# Patient Record
Sex: Male | Born: 1975
Health system: Southern US, Community
[De-identification: ages and names within clinical notes are randomized; demographics above are authoritative.]

## PROBLEM LIST (undated history)

## (undated) HISTORY — PX: FRACTURE SURGERY: SHX138

---

## 2005-05-04 ENCOUNTER — Ambulatory Visit (HOSPITAL_COMMUNITY): Admission: RE | Admit: 2005-05-04 | Discharge: 2005-05-05 | Payer: Self-pay | Admitting: Orthopaedic Surgery

## 2006-10-04 ENCOUNTER — Other Ambulatory Visit: Admission: RE | Admit: 2006-10-04 | Discharge: 2006-10-04 | Payer: Self-pay | Admitting: Otolaryngology

## 2006-11-13 ENCOUNTER — Encounter: Admission: RE | Admit: 2006-11-13 | Discharge: 2006-11-13 | Payer: Self-pay | Admitting: Otolaryngology

## 2008-02-24 ENCOUNTER — Emergency Department (HOSPITAL_BASED_OUTPATIENT_CLINIC_OR_DEPARTMENT_OTHER): Admission: EM | Admit: 2008-02-24 | Discharge: 2008-02-24 | Payer: Self-pay | Admitting: Emergency Medicine

## 2009-07-05 IMAGING — CT CT NECK W/ CM
3 of 4 series · 16 of 33 positions shown, 19 images · IV contrast ([ID] OMNI 300)
Comparison: None.

CLINICAL DATA: Painless left neck mass for six months.  Question left branchial cleft cyst.  
NECK CT WITH CONTRAST:
TECHNIQUE: Multidetector CT imaging of the neck was performed following the standard protocol during administration of intravenous contrast.
Contrast:  100 cc of Omnipaque 300.

[Series 2: neck w/ · axial · 0.39mm/px · z∈[-275,-35]mm · 8 of 82 slices shown, 10 images]
[im 9/82  soft-tissue]
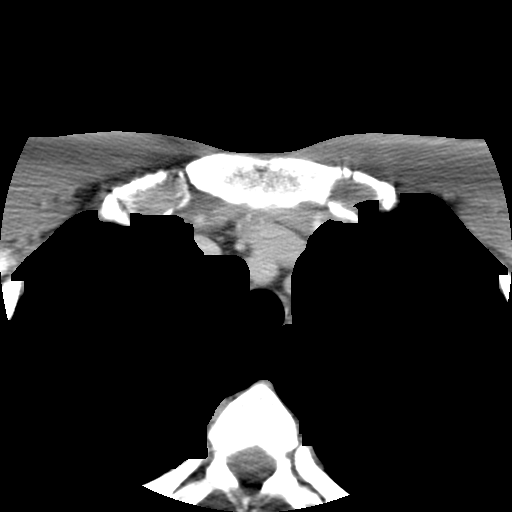
[im 9/82  bone]
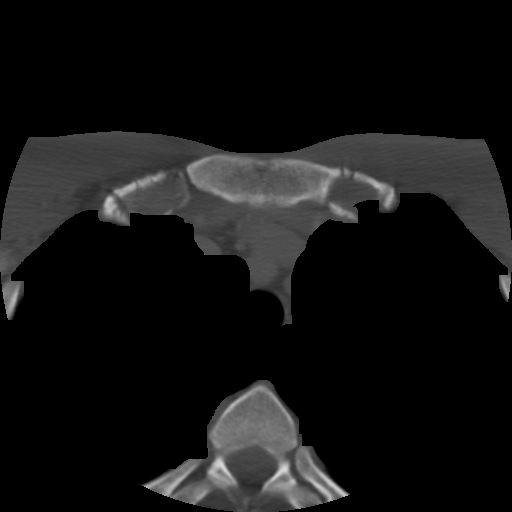
[im 17/82  bone]
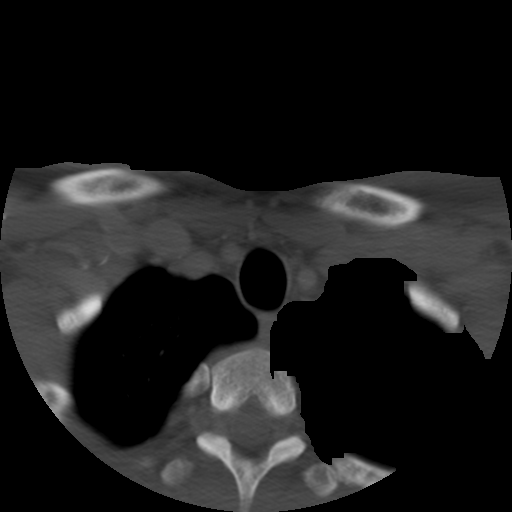
[im 25/82  bone]
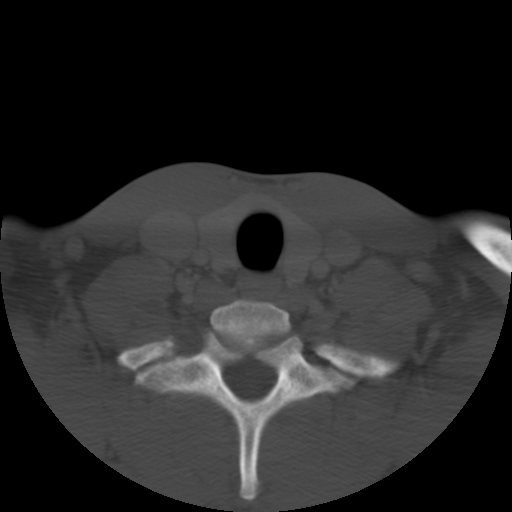
[im 33/82  bone]
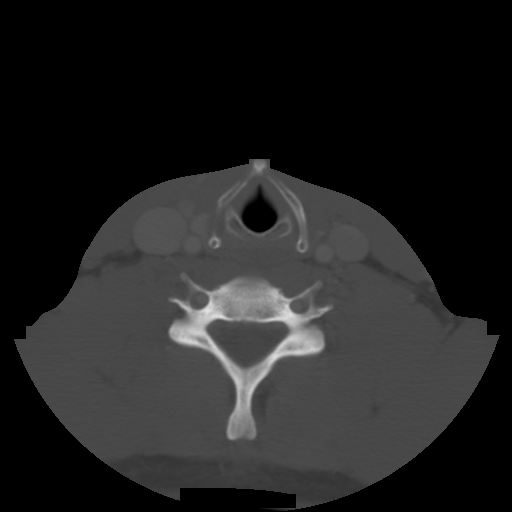
[im 49/82  soft-tissue]
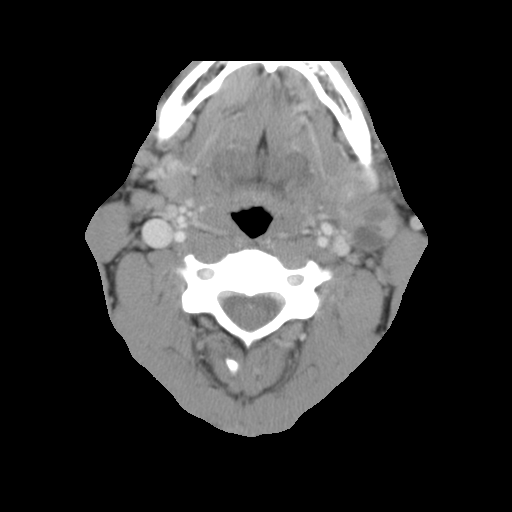
[im 49/82  bone]
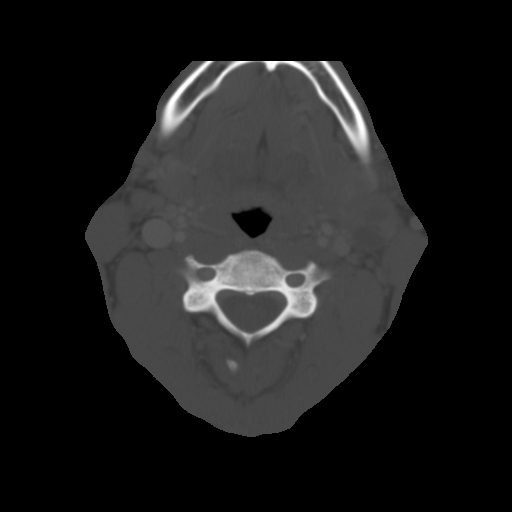
[im 57/82  bone]
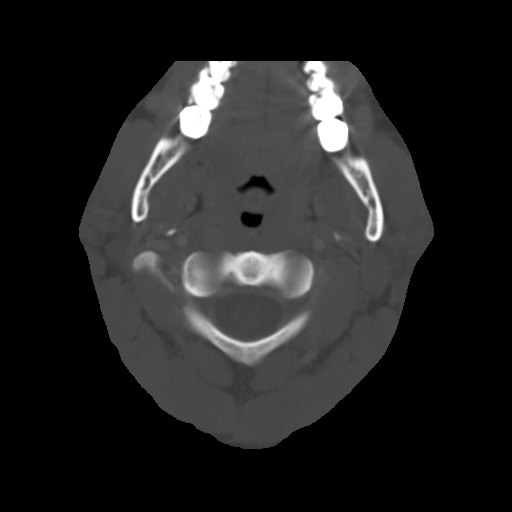
[im 65/82  bone]
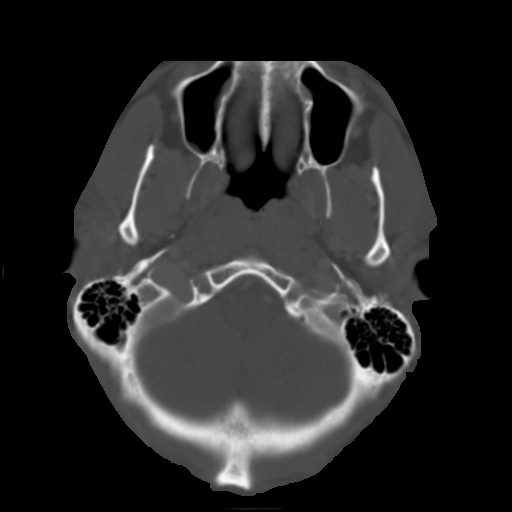
[im 73/82  bone]
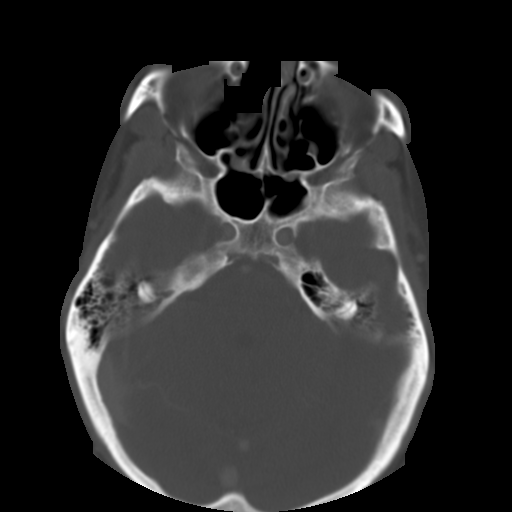

[Series 400: cor neck · coronal · 0.61mm/px · 3 of 80 slices shown]
[im 16/80  bone]
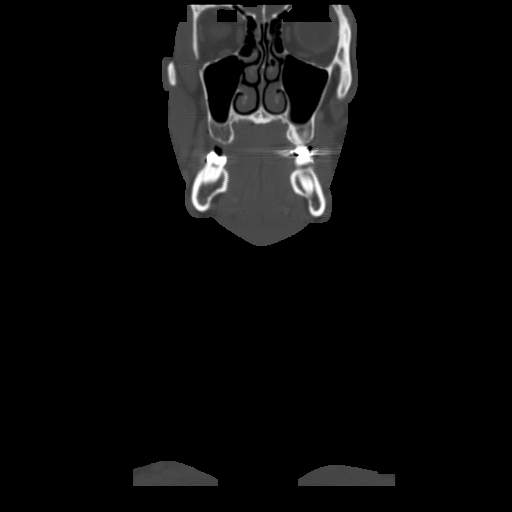
[im 32/80  bone]
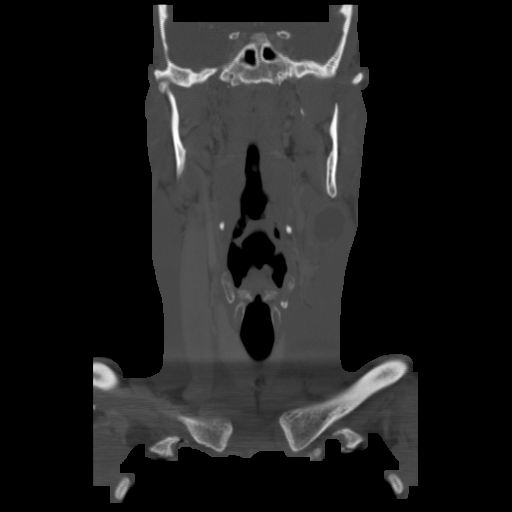
[im 48/80  bone]
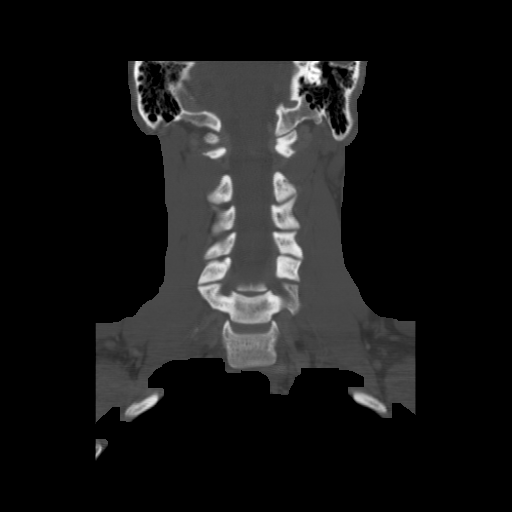

[Series 401: sag neck · sagittal · 0.61mm/px · 5 of 80 slices shown, 6 images]
[im 27/80  bone]
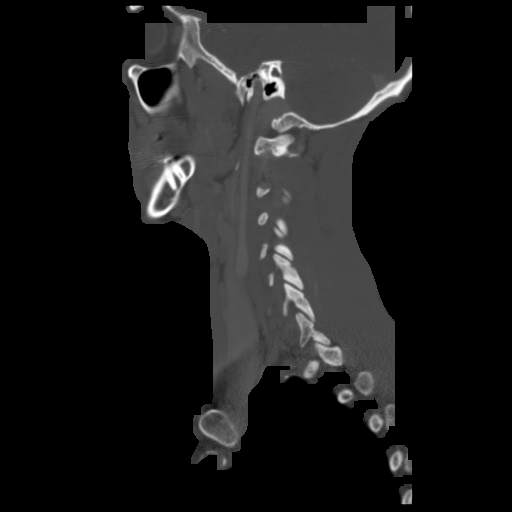
[im 33/80  bone]
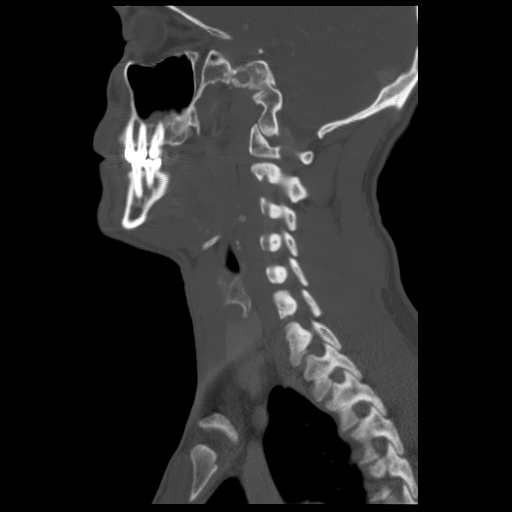
[im 40/80  soft-tissue]
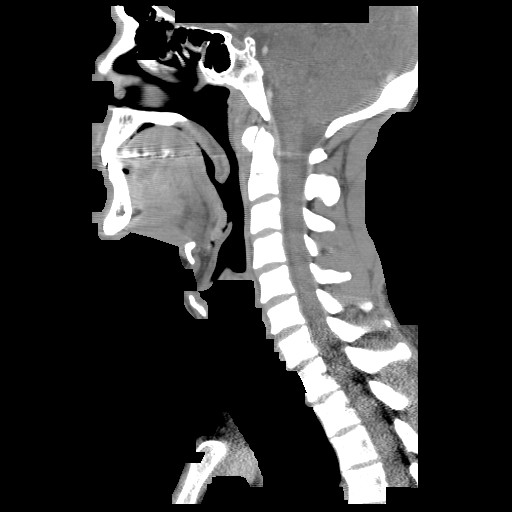
[im 40/80  bone]
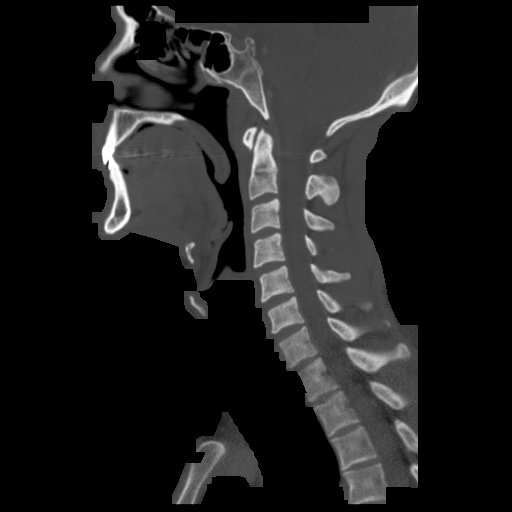
[im 47/80  bone]
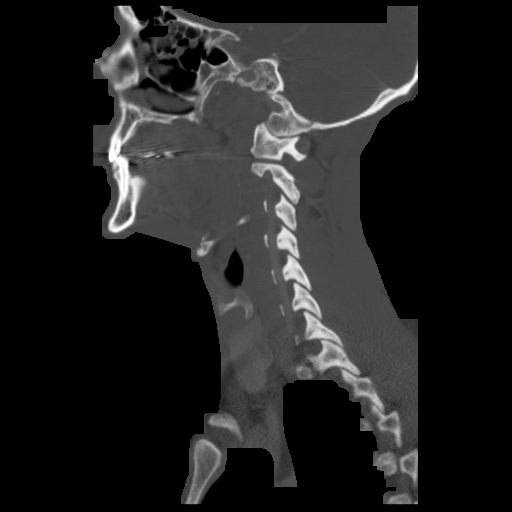
[im 53/80  bone]
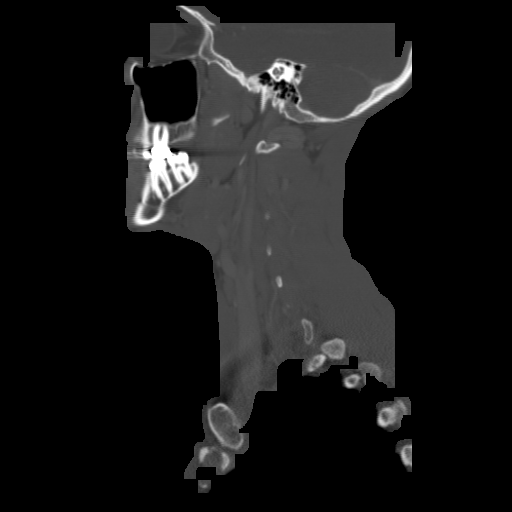

[16 of 33 positions shown; findings below may reference images not displayed]

FINDINGS: Located between the posterior aspect of the left submandibular gland and the anteromedial aspect of the left sternocleidomastoid muscle is a 2.6 x 2.3 x 2.7 cm mass which is of low density centrally (Hounsfield units slightly higher than simple fluid) and has peripheral slight irregularity enhancement with minimal septation.  The location is suggestive of left branchial cleft cyst which appears minimally complex secondary to result of infection/prior infections.   secondary consideration is a cystic necrotic metastatic node.  No other adenopathy.  No primary malignancy identified.  
Minimal scarring of lung apices.  No focal thyroid lesion.  Visualized intracranial structures are unremarkable.  Slight narrowing of the left jugular vein at the C1 level without adjacent mass.  This may represent a normal variant.
IMPRESSION: Left neck mass suggestive of a second branchial cleft cyst. A less likely consideration is a cystic necrotic metastatic node.  No other adenopathy.  No primary malignancy identified. Please see above discussion.

## 2010-05-16 DIAGNOSIS — K5190 Ulcerative colitis, unspecified, without complications: Secondary | ICD-10-CM

## 2010-05-19 NOTE — Op Note (Signed)
Ryan Sutton, Ryan Sutton               ACCOUNT NO.:  [id number]   MEDICAL RECORD NO.:  [id number]          PATIENT TYPE:  OIB   LOCATION:  5004                         FACILITY:  [facility]   PHYSICIAN:  [name]. Magnus Ivan, M.D.DATE OF BIRTH:  07-17-1975   DATE OF PROCEDURE:  05/04/2005  DATE OF DISCHARGE:                                 OPERATIVE REPORT   PREOPERATIVE DIAGNOSIS:  Left radial shaft fracture.   POSTOPERATIVE DIAGNOSIS:  Left radial shaft fracture.   PROCEDURE:  Open reduction and internal fixation of left radial shaft  fracture using 3.5 mm LC-DCP plate.   SURGEON:  [name]. Magnus Ivan, M.D.   ANESTHESIA:  General.   ANTIBIOTICS:  1 g IV Ancef.   BLOOD LOSS:  Minimal.   TOURNIQUET TIME:  1 hour 1 minute.   COMPLICATIONS:  None.   INDICATIONS:  Briefly, Mr. Seamans is a 35 year old right-hand dominant male  who was involved in wrestling and martial arts type of event when he  sustained a fracture to his nondominant right radius.  This was a fracture  at the shaft.  This occurred yesterday.  He was seen at an urgent care  center and placed in a splint and given follow-up in my office today.  I  decided to take him to the operating room tonight due to the unstable nature  of the fracture such as this.  The risks and benefits of surgery were  explained to him and were well-understood, and he agreed to proceed with  surgery.   PROCEDURE DESCRIPTION:  After informed consent was obtained and the  appropriate left arm was marked, Mr. Fray was brought to the operating room  and placed supine on the operating table.  General anesthesia was then  obtained.  A nonsterile tourniquet placed around his upper arm, his  splint  was removed and his arm was prepped and draped with DuraPrep and sterile  drapes.  A standard anterior approach to the forearm was carried out, which  is the approach of Sherilyn Cooter.  Meticulous attention was carried for dissection  of the soft tissue  structures.  The neurovascular bundles were all  protected.  The fracture then was easily identified the fracture hematoma  was cleaned of debris using irrigation and a small rongeur.  Once the  fracture was identified, reduction forceps were used to bring the fracture  into an easily reducible position.  A seven-hole 3.5 mm LC-DCP plate was  then chosen and placed on the volar aspect of the radial shaft.  One screw  was placed in a lag format lagging the fracture, and then the plate was  further secured with three proximal and three distal screws, all bicortical.  The two screws on either side of the fracture were placed in compression.  Once the fracture was reduced and the plate was secured, I did assess the  forearm radiographically.  All screw lengths were found to be adequate.  The  arm was stable in terms of the fracture and overall alignment.  I then  assessed the wrist joint for stability of the DRUJ.  Under  fluoroscopic  guidance the wrist joint was table.  The wound was then copiously irrigated  and the tourniquet was let down at 1 hour 1 minute and hemostasis was  obtained.  I then closed the subcutaneous tissue with a 2-0 Vicryl suture  followed by interrupted 3-0 Prolene sutures along the incision.  Xeroform  followed by a well-padded sterile dressing was applied and a well-padded  posterior splint with the forearm in a supinated position.  Once the  tourniquet again was let down, the fingers had taken nicely.  After the  splint hardened the patient was awakened, extubated, and taken to the  recovery room in stable condition.  There were no complications, and all  final counts were correct.           ______________________________  [name]. Magnus Ivan, M.D.     CYB/MEDQ  D:  05/04/2005  T:  05/07/2005  Job:  [id number]

## 2017-03-27 ENCOUNTER — Other Ambulatory Visit: Payer: Self-pay

## 2017-03-27 ENCOUNTER — Ambulatory Visit (INDEPENDENT_AMBULATORY_CARE_PROVIDER_SITE_OTHER): Payer: BLUE CROSS/BLUE SHIELD | Admitting: Family Medicine

## 2017-03-27 ENCOUNTER — Encounter: Payer: Self-pay | Admitting: Family Medicine

## 2017-03-27 VITALS — BP 100/62 | HR 60 | Temp 97.8°F | Ht 71.5 in | Wt 190.2 lb

## 2017-03-27 DIAGNOSIS — M5416 Radiculopathy, lumbar region: Secondary | ICD-10-CM

## 2017-03-27 MED ORDER — AMITRIPTYLINE HCL 25 MG PO TABS: 25.0000 mg | ORAL_TABLET | Freq: Every day | ORAL | 1 refills | Status: DC

## 2017-03-27 MED ORDER — PREDNISONE 20 MG PO TABS: ORAL_TABLET | ORAL | 0 refills | Status: DC

## 2017-03-27 NOTE — Progress Notes (Signed)
Dr. Karleen HampshireSpencer T. Lem Peary, MD, CAQ Sports Medicine Primary Care and Sports Medicine [address] [place], [address] Phone: [phone] Fax: [phone]  03/27/2017  Patient: Ryan Sutton, MRN: [id number], DOB: 10/06/1975, 42 y.o.  Primary Physician:  No primary care provider on file.   Chief Complaint  Patient presents with  . Hip Pain  . Sciatica   Subjective:   Ryan HarmsGabriel Sutton is a 42 y.o. very pleasant male patient who presents with the following: Back Pain  New patient, known well.  ongoing for approximately: 2 mo The patient has had back pain before. The back pain is localized into the L buttocks area. They also describe L radiculopathy.  Numbness in the bottom of the left foot. Comes and goes.  Sometimes cannot lift his left shin.  No training accidents - no back pain.   Massage therapist.  Heating pad, epsom soaks, ibuprofen.  - none has helped too much  As above numbness or tingling. No bowel or bladder incontinence. Weakness at L toe Prior interventions: none Physical therapy: No Chiropractic manipulations: no Acupuncture: No Osteopathic manipulation: No Heat or cold: Minimal effect  Past Medical History, Surgical History, Family History, Medications, Allergies have been reviewed and updated if relevant.  Medication list reviewed and [facility].  GEN: No fevers, chills. Nontoxic. Primarily MSK c/o today. MSK: Detailed in the HPI GI: tolerating PO intake without difficulty Neuro: As above  Otherwise the pertinent positives of the ROS are noted above.    Objective:   Blood pressure 100/62, pulse 60, temperature 97.8 F (36.6 C), temperature source Oral, height 5' 11.5" (1.816 m), weight 190 lb 4 oz (86.3 kg).  Gen: Well-developed,well-nourished,in no acute distress; alert,appropriate and cooperative throughout examination HEENT: Normocephalic and atraumatic without obvious abnormalities.  Ears, externally no  deformities Pulm: Breathing comfortably in no respiratory distress Range of motion at  the waist: Flexion, rotation and lateral bending: full  No echymosis or edema Rises to examination table with no difficulty Gait: minimally antalgic  Inspection/Deformity: No abnormality Paraspinus T:  Minimally tender  B Ankle Dorsiflexion (L5,4): 5/5 B Great Toe Dorsiflexion (L5,4): 4-/5 Heel Walk (L5): WNL Toe Walk (S1): WNL Rise/Squat (L4): WNL, mild pain  SENSORY B Medial Foot (L4): decreased B Dorsum (L5): decreased B Lateral (S1): decreased Light Touch: decreased Pinprick: decreased - at the L foot  REFLEXES Knee (L4): 2+ Ankle (S1): 2+  B SLR, seated: neg B SLR, supine: + on the L B FABER: neg B Reverse FABER: neg B Greater Troch: NT B Log Roll: neg B Stork: NT B Sciatic Notch: NT  Radiology: No results found.  Assessment and Plan:   Left lumbar radiculopathy   Intermittent problem over years, but worse in the last 2 weeks.  Radicular symptoms with some numbness and some weakness at the great toe.  Concerning for potential L4-5 or L5-S1 impairment.  We will try a longer course of some steroids as well as some amitriptyline, and hopefully this will bring him some relief.  We will touch base in a few weeks to see how he is doing.  Meds ordered this encounter  Medications  . predniSONE (DELTASONE) 20 MG tablet    Sig: 2 tabs po for 7 days, then 1 tab po for 7 days    Dispense:  21 tablet    Refill:  0  . amitriptyline (ELAVIL) 25 MG tablet    Sig: Take 1 tablet (25 mg total) by mouth  at bedtime.    Dispense:  30 tablet    Refill:  1   Signed,  Aarit Kashuba T. Killian Ress, MD   Allergies as of 03/27/2017      Reactions   Latex Rash   Shellfish Allergy Swelling      Medication List        Accurate as of 03/27/17 11:59 PM. Always use your most recent med list.          amitriptyline 25 MG tablet Commonly known as:  ELAVIL Take 1 tablet (25 mg total) by mouth  at bedtime.   Fish Oil 1000 MG Caps Take by mouth.   predniSONE 20 MG tablet Commonly known as:  DELTASONE 2 tabs po for 7 days, then 1 tab po for 7 days   THERA Tabs Take by mouth.

## 2017-03-29 ENCOUNTER — Encounter: Payer: Self-pay | Admitting: Family Medicine

## 2017-09-10 DIAGNOSIS — R1031 Right lower quadrant pain: Secondary | ICD-10-CM | POA: Diagnosis not present

## 2017-09-10 DIAGNOSIS — R109 Unspecified abdominal pain: Secondary | ICD-10-CM | POA: Diagnosis not present

## 2017-09-10 DIAGNOSIS — R1011 Right upper quadrant pain: Secondary | ICD-10-CM | POA: Diagnosis not present

## 2017-09-11 ENCOUNTER — Encounter (HOSPITAL_COMMUNITY): Payer: Self-pay

## 2017-09-11 ENCOUNTER — Observation Stay (HOSPITAL_COMMUNITY)
Admission: EM | Admit: 2017-09-11 | Discharge: 2017-09-14 | Disposition: A | Discharge status: Home / Self Care | Payer: BLUE CROSS/BLUE SHIELD | Attending: Surgery | Admitting: Surgery

## 2017-09-11 ENCOUNTER — Ambulatory Visit (INDEPENDENT_AMBULATORY_CARE_PROVIDER_SITE_OTHER): Payer: BLUE CROSS/BLUE SHIELD | Admitting: Family Medicine

## 2017-09-11 ENCOUNTER — Other Ambulatory Visit: Payer: Self-pay

## 2017-09-11 ENCOUNTER — Encounter: Payer: Self-pay | Admitting: Family Medicine

## 2017-09-11 VITALS — BP 104/70 | HR 79 | Temp 98.8°F | Ht 71.5 in | Wt 185.5 lb

## 2017-09-11 DIAGNOSIS — K8000 Calculus of gallbladder with acute cholecystitis without obstruction: Principal | ICD-10-CM | POA: Insufficient documentation

## 2017-09-11 DIAGNOSIS — K82A1 Gangrene of gallbladder in cholecystitis: Secondary | ICD-10-CM | POA: Diagnosis not present

## 2017-09-11 DIAGNOSIS — Z91013 Allergy to seafood: Secondary | ICD-10-CM | POA: Diagnosis not present

## 2017-09-11 DIAGNOSIS — Z87891 Personal history of nicotine dependence: Secondary | ICD-10-CM | POA: Diagnosis not present

## 2017-09-11 DIAGNOSIS — K828 Other specified diseases of gallbladder: Secondary | ICD-10-CM | POA: Diagnosis not present

## 2017-09-11 DIAGNOSIS — K810 Acute cholecystitis: Secondary | ICD-10-CM

## 2017-09-11 DIAGNOSIS — R935 Abnormal findings on diagnostic imaging of other abdominal regions, including retroperitoneum: Secondary | ICD-10-CM | POA: Diagnosis not present

## 2017-09-11 DIAGNOSIS — Z91040 Latex allergy status: Secondary | ICD-10-CM | POA: Diagnosis not present

## 2017-09-11 DIAGNOSIS — R828 Abnormal findings on cytological and histological examination of urine: Secondary | ICD-10-CM | POA: Diagnosis not present

## 2017-09-11 DIAGNOSIS — K8020 Calculus of gallbladder without cholecystitis without obstruction: Secondary | ICD-10-CM | POA: Diagnosis not present

## 2017-09-11 DIAGNOSIS — K8010 Calculus of gallbladder with chronic cholecystitis without obstruction: Secondary | ICD-10-CM | POA: Diagnosis present

## 2017-09-11 DIAGNOSIS — K819 Cholecystitis, unspecified: Secondary | ICD-10-CM

## 2017-09-11 DIAGNOSIS — R109 Unspecified abdominal pain: Secondary | ICD-10-CM | POA: Diagnosis not present

## 2017-09-11 DIAGNOSIS — R110 Nausea: Secondary | ICD-10-CM | POA: Diagnosis not present

## 2017-09-11 LAB — COMPREHENSIVE METABOLIC PANEL
ALBUMIN: 3.6 g/dL (ref 3.5–5.0)
ALT: 200 U/L — ABNORMAL HIGH (ref 0–44)
AST: 140 U/L — ABNORMAL HIGH (ref 15–41)
Alkaline Phosphatase: 106 U/L (ref 38–126)
Anion gap: 7 (ref 5–15)
BUN: 11 mg/dL (ref 6–20)
CHLORIDE: 100 mmol/L (ref 98–111)
CO2: 30 mmol/L (ref 22–32)
CREATININE: 1.07 mg/dL (ref 0.61–1.24)
Calcium: 9 mg/dL (ref 8.9–10.3)
GFR calc non Af Amer: >60 mL/min (ref >60)
GLUCOSE: 120 mg/dL — AB (ref 70–99)
Potassium: 4 mmol/L (ref 3.5–5.1)
SODIUM: 137 mmol/L (ref 135–145)
Total Bilirubin: 0.8 mg/dL (ref 0.3–1.2)
Total Protein: 7.3 g/dL (ref 6.5–8.1)

## 2017-09-11 LAB — CBC
HCT: 37.3 % — ABNORMAL LOW (ref 39.0–52.0)
HEMOGLOBIN: 12.7 g/dL — AB (ref 13.0–17.0)
MCH: 31.8 pg (ref 26.0–34.0)
MCHC: 34 g/dL (ref 30.0–36.0)
MCV: 93.5 fL (ref 78.0–100.0)
PLATELETS: 175 10*3/uL (ref 150–400)
RBC: 3.99 MIL/uL — AB (ref 4.22–5.81)
RDW: 12.4 % (ref 11.5–15.5)
WBC: 16.4 10*3/uL — ABNORMAL HIGH (ref 4.0–10.5)

## 2017-09-11 MED ORDER — SODIUM CHLORIDE 0.9 % IV SOLN
INTRAVENOUS | Status: DC
  Administered 2017-09-11: 21:00:00 via INTRAVENOUS

## 2017-09-11 MED ORDER — ACETAMINOPHEN 325 MG PO TABS
650.0000 mg | ORAL_TABLET | Freq: Once | ORAL | Status: AC
  Administered 2017-09-11: 650 mg via ORAL
  Filled 2017-09-11: qty 2

## 2017-09-11 MED ORDER — SODIUM CHLORIDE 0.9 % IV SOLN
2.0000 g | Freq: Once | INTRAVENOUS | Status: AC
  Administered 2017-09-11: 2 g via INTRAVENOUS
  Filled 2017-09-11: qty 20

## 2017-09-11 NOTE — ED Triage Notes (Signed)
Pt states that he was sent from PCP for cholecystomy. Pt states he is not currently having as much pain as yesterday.

## 2017-09-11 NOTE — ED Notes (Signed)
Surgerion was at bedside.

## 2017-09-11 NOTE — Progress Notes (Signed)
Dr. Karleen Hampshire T. Jacoby Zanni, MD, CAQ Sports Medicine Primary Care and Sports Medicine [address] [place], [address] Phone: [phone] Fax: [phone]  09/11/2017  Patient: Ryan Sutton, MRN: [id number], DOB: [birth year], 42 y.o.  Primary Physician:  No primary care provider on file.   Chief Complaint  Patient presents with  . Abdominal Pain    Started Sunday evening   Subjective:   Ryan Sutton is a 42 y.o. very pleasant male patient who presents with the following:  New onset abdominal pain which started on Sunday.   Sunday night ate some chicken and rice and was not really anything too bad. Got gradually worse and then threw up and partially digested pepperoni. RUQ pain and it was pretty intense and did not sleep much. Monday went to work, felt like had a fever. Tok some advill and went home. UC told him to go to the ER.   Labs reviewed WBC 18,000 No diff  AST and ALT 60's Lipase and amylase are normal  Fever was close to 100 most of the day.  [place] was this morning - sheets drenched with sweat. 101 temperature this morning.   He is feeling better this morning. He did not eat much lunch. Has been having BM and flatus.  No prior abd surgery.  Past Medical History, Surgical History, Social History, Family History, Problem List, Medications, and Allergies have been reviewed and updated if relevant.  There are no active problems to display for this patient.   No past medical history on file.  Past Surgical History:  Procedure Laterality Date  . FRACTURE SURGERY      Social History   Socioeconomic History  . Marital status: Married    Spouse name: Not on file  . Number of children: 0  . Years of education: Not on file  . Highest education level: Not on file  Occupational History  . Occupation: tattoo    Comment: [employer]  Social Needs  . Financial resource strain: Not on file  . Food insecurity:    Worry: Not on file    Inability: Not on file   . Transportation needs:    Medical: Not on file    Non-medical: Not on file  Tobacco Use  . Smoking status: Former Games developer  . Smokeless tobacco: Former Engineer, water and Sexual Activity  . Alcohol use: Yes  . Drug use: Not Currently  . Sexual activity: Not on file  Lifestyle  . Physical activity:    Days per week: Not on file    Minutes per session: Not on file  . Stress: Not on file  Relationships  . Social connections:    Talks on phone: Not on file    Gets together: Not on file    Attends religious service: Not on file    Active member of club or organization: Not on file    Attends meetings of clubs or organizations: Not on file    Relationship status: Not on file  . Intimate partner violence:    Fear of current or ex partner: Not on file    Emotionally abused: Not on file    Physically abused: Not on file    Forced sexual activity: Not on file  Other Topics Concern  . 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  Allergies  Allergen Reactions  . Latex Rash  . Shellfish Allergy Swelling    Medication list reviewed and updated in full in [place] Link.   GEN: + fever and chills GI: + N and V Pulm: No SOB Interactive and getting along well at home.  Otherwise, ROS is as per the HPI.  Objective:   BP 104/70   Pulse 79   Temp 98.8 F (37.1 C) (Oral)   Ht 5' 11.5" (1.816 m)   Wt 185 lb 8 oz (84.1 kg)   BMI 25.51 kg/m   GEN: WDWN, NAD, Non-toxic, A & O x 3 HEENT: Atraumatic, Normocephalic. Neck supple. No masses, No LAD. Ears and Nose: No external deformity. CV: RRR, No M/G/R. No JVD. No thrill. No extra heart sounds. PULM: CTA B, no wheezes, crackles, rhonchi. No retractions. No resp. distress. No accessory muscle use. ABD: S, RUQ tenderness, ND, + BS, No rebound, No HSM  EXTR: No c/c/e NEURO Normal gait.  PSYCH:  Normally interactive. Conversant. Not depressed or anxious appearing.  Calm demeanor.   Laboratory and Imaging Data: Acute Interface, Incoming Rad Results - 09/11/2017 11:48 AM EDT TECHNIQUE: Complete abdominal ultrasound. COMPARISON: None.   INDICATION: Abd Pain.  Right-sided abdominal pain, fever, nausea  FINDINGS:  PANCREAS:  No focal abnormalities are identified. Visualization is limited.  VASCULATURE: No abdominal aortic aneurysm. Visualized IVC is patent. Portal vein is patent with normal flow direction.  HEPATOBILIARY: Liver: Normal. 8 mm cyst in the right lobe. Prominent intrahepatic ducts.  Gallbladder: Gallstones. Gallbladder wall is thickened measuring up to 9 mm. There are 2 hypoechoic areas within the gallbladder measuring up to 2.7 cm. No internal vascularity. CBD: Normal. No intrahepatic biliary ductal dilatation.  KIDNEYS: Both kidneys are normal in size. No hydronephrosis. No suspicious masses. Normal renal echotexture.  SPLEEN: Size is within normal limits. No focal abnormality.  MISC: N./A.   IMPRESSION: Gallstones and gallbladder wall thickening. Findings could be seen in the setting of acute cholecystitis. There are 2 hypoechoic avascular areas in the gallbladder may represent tumefactive sludge versus less likely mass.  Electronically Signed by: Clent Demark  Assessment and Plan:   Acute cholecystitis  WIth WBC 18,000, Fever to 101, [place] evidence of 9 mm gallbladder wall thickening, + RUQ pain on exam, c/w acute cholecystitis.   I spoke with one of my partners and one of the general surgeons at [facility], and all in agreement that he should go to the ER for assessment, likely admission and IV ABX with surgical consult.   He was initially reluctant, but he and his wife agreed and are on the way to [facility] via private vehicle.   Follow-up: To [facility] ER  Signed,  Karleen Hampshire T. Tabathia Knoche, MD   Allergies as of 09/11/2017      Reactions   Latex Rash    Shellfish Allergy Swelling      Medication List        Accurate as of 09/11/17  4:49 PM. Always use your most recent med list.          Fish Oil 1000 MG Caps Take by mouth.   THERA Tabs Take by mouth.   zinc gluconate 50 MG tablet Take by mouth.

## 2017-09-11 NOTE — Patient Instructions (Signed)
Go to [facility] ER - tell the "I have acute cholecystitis."

## 2017-09-11 NOTE — ED Provider Notes (Signed)
[facility]-EMERGENCY DEPT Provider Note   CSN: [id number] Arrival date & time: 09/11/17  1715     History   Chief Complaint Chief Complaint  Patient presents with  . Abdominal Pain    HPI Ryan Sutton is a 42 y.o. male.  HPI Patient presented to the emergency room for evaluation of cholecystitis.  Patient started having pain in his right upper abdomen on Sunday.  Pain gradually increased throughout the day.  He started having some nausea and vomiting.  Next day the patient felt like he had a fever.  Patient went to an urgent care.  They recommended ER evaluation.  Patient declined and ended up getting an outpatient ultrasound September 11.  Patient's ultrasound showed that he had gallbladder wall thickening associated with gallstones.  The patient saw his doctor today.  They reviewed the ultrasound findings as well as his blood tests that showed white blood cell count of 18,000.  His primary care doctor contacted the general surgeon who recommended he come to the emergency room for evaluation and probably cholecystectomy.  History reviewed. No pertinent past medical history.  There are no active problems to display for this patient.   Past Surgical History:  Procedure Laterality Date  . FRACTURE SURGERY          Home Medications    Prior to Admission medications   Medication Sig Start Date End Date Taking? Authorizing Provider  acetaminophen (TYLENOL) 500 MG tablet Take 1,000 mg by mouth daily as needed for moderate pain.   Yes [provider]  Multiple Vitamin (THERA) TABS Take by mouth.   Yes [provider]  Omega-3 Fatty Acids (FISH OIL) 1000 MG CAPS Take by mouth.   Yes [provider]  zinc gluconate 50 MG tablet Take by mouth.   Yes [provider]    Family History No family history on file.  Social History Social History   Tobacco Use  . Smoking status: Former Games developer  . Smokeless tobacco: Former  Engineer, water Use Topics  . Alcohol use: Yes  . Drug use: Not Currently     Allergies   Latex and Shellfish allergy   Review of Systems Review of Systems  All other systems reviewed and are negative.    Physical Exam Updated Vital Signs BP 137/79 (BP Location: Left Arm)   Pulse 81   Temp 99.6 F (37.6 C) (Oral)   Resp 16   Ht 1.829 m (6')   Wt 83.9 kg   SpO2 100%   BMI 25.09 kg/m   Physical Exam  Constitutional: He appears well-developed and well-nourished. No distress.  HENT:  Head: Normocephalic and atraumatic.  Right Ear: External ear normal.  Left Ear: External ear normal.  Eyes: Conjunctivae are normal. Right eye exhibits no discharge. Left eye exhibits no discharge. No scleral icterus.  Neck: Neck supple. No tracheal deviation present.  Cardiovascular: Normal rate, regular rhythm and intact distal pulses.  Pulmonary/Chest: Effort normal and breath sounds normal. No stridor. No respiratory distress. He has no wheezes. He has no rales.  Abdominal: Soft. Bowel sounds are normal. He exhibits no distension. There is tenderness in the right upper quadrant. There is no rigidity, no rebound and no guarding.  Musculoskeletal: He exhibits no edema or tenderness.  Neurological: He is alert. He has normal strength. No cranial nerve deficit (no facial droop, extraocular movements intact, no slurred speech) or sensory deficit. He exhibits normal muscle tone. He displays no seizure activity. Coordination  normal.  Skin: Skin is warm and dry. No rash noted.  Psychiatric: He has a normal mood and affect.  Nursing note and vitals reviewed.    ED Treatments / Results  Labs (all labs ordered are listed, but only abnormal results are displayed) Labs Reviewed  CBC  COMPREHENSIVE METABOLIC PANEL     Radiology Outpatient [place] reviewed  Procedures Procedures (including critical care time)  Medications Ordered in ED Medications  0.9 %  sodium chloride infusion (has no  administration in time range)  cefTRIAXone (ROCEPHIN) 2 g in sodium chloride 0.9 % 100 mL IVPB (has no administration in time range)     Initial Impression / Assessment and Plan / ED Course  I have reviewed the triage vital signs and the nursing notes.  Pertinent labs & imaging results that were available during my care of the patient were reviewed by me and considered in my medical decision making (see chart for details).  Clinical Course as of Sep 12 1998  Wed Sep 11, 2017  2000 D/w Dr Carolynne Edouard.  He will come evaluate the patient in the ED   [JK]    Clinical Course User Index [JK] Linwood Dibbles, MD  Patient presented to the emergency room for evaluation of cholecystitis.  Patient had an outpatient evaluation already that included an elevated white blood cell count of 18,000 and an ultrasound that showed gallstones sassociated with gallbladder wall thickening.  Patient appears stable in the emergency room.  I will repeat his labs and consult with general surgery.  Final Clinical Impressions(s) / ED Diagnoses   Final diagnoses:  Cholecystitis      Linwood Dibbles, MD 09/11/17 2000

## 2017-09-11 NOTE — H&P (Signed)
Ryan Sutton is an 42 y.o. male.   Chief Complaint: abd pain HPI: The patient is a 42 year old white male who presents with abdominal pain that started on Sunday.  The pain has been located in the right upper quadrant.  The pain is been associated with 1 or 2 episodes of nausea and vomiting.  The pain persisted so he went to an urgent care today where an ultrasound showed thickening of the gallbladder wall and stones in the gallbladder.  He has run a fever at home.  His white count has been elevated.  History reviewed. No pertinent past medical history.  Past Surgical History:  Procedure Laterality Date  . FRACTURE SURGERY      No family history on file. Social History:  reports that he has quit smoking. He has quit using smokeless tobacco. He reports that he drinks alcohol. He reports that he has current or past drug history.  Allergies:  Allergies  Allergen Reactions  . Latex Rash  . Shellfish Allergy Swelling     (Not in a hospital admission)  Results for orders placed or performed during the hospital encounter of 09/11/17 (from the past 48 hour(s))  CBC     Status: Abnormal   Collection Time: 09/11/17  7:45 PM  Result Value Ref Range   WBC 16.4 (H) 4.0 - 10.5 K/uL   RBC 3.99 (L) 4.22 - 5.81 MIL/uL   Hemoglobin 12.7 (L) 13.0 - 17.0 g/dL   HCT 56.2 (L) 13.0 - 86.5 %   MCV 93.5 78.0 - 100.0 fL   MCH 31.8 26.0 - 34.0 pg   MCHC 34.0 30.0 - 36.0 g/dL   RDW 78.4 69.6 - 29.5 %   Platelets 175 150 - 400 K/uL    Comment: Performed at [facility], [address], [place], [place] [address]   No results found.  Review of Systems  Constitutional: Positive for fever.  HENT: Negative.   Eyes: Negative.   Respiratory: Negative.   Cardiovascular: Negative.   Gastrointestinal: Positive for abdominal pain, nausea and vomiting.  Genitourinary: Negative.   Musculoskeletal: Negative.   Skin: Negative.   Neurological: Negative.   Endo/Heme/Allergies: Negative.    Psychiatric/Behavioral: Negative.     Blood pressure 123/67, pulse 83, temperature 100.1 F (37.8 C), temperature source Oral, resp. rate 18, height 6' (1.829 m), weight 83.9 kg, SpO2 97 %. Physical Exam  Constitutional: He is oriented to person, place, and time. He appears well-developed and well-nourished. No distress.  HENT:  Head: Normocephalic and atraumatic.  Mouth/Throat: No oropharyngeal exudate.  Eyes: Pupils are equal, round, and reactive to light. Conjunctivae and EOM are normal.  Neck: Normal range of motion. Neck supple.  Cardiovascular: Normal rate, regular rhythm and normal heart sounds.  Respiratory: Effort normal and breath sounds normal. No stridor. No respiratory distress.  GI: Soft. Bowel sounds are normal.  There is moderate tenderness in RUQ  Musculoskeletal: Normal range of motion. He exhibits no edema or tenderness.  Neurological: He is alert and oriented to person, place, and time. Coordination normal.  Skin: Skin is warm and dry. No rash noted.  Psychiatric: He has a normal mood and affect. His behavior is normal. Thought content normal.     Assessment/Plan The patient appears to have cholecystitis with cholelithiasis.  Because of the risk of further painful episodes and possible pancreatitis I think he would benefit from having his gallbladder removed.  I have discussed with him in detail the risks and benefits of the  operation as well as some of the technical aspects and he understands and wishes to proceed.  I will discuss this with the primary team in the morning and they will decide on timing of surgery.  Robyne Askew, MD 09/11/2017, 9:23 PM

## 2017-09-12 ENCOUNTER — Encounter (HOSPITAL_COMMUNITY)
Admission: EM | Disposition: A | Discharge status: Home / Self Care | Payer: Self-pay | Source: Home / Self Care | Attending: Emergency Medicine

## 2017-09-12 ENCOUNTER — Other Ambulatory Visit: Payer: Self-pay

## 2017-09-12 ENCOUNTER — Observation Stay (HOSPITAL_COMMUNITY): Payer: BLUE CROSS/BLUE SHIELD | Admitting: Registered Nurse

## 2017-09-12 ENCOUNTER — Encounter (HOSPITAL_COMMUNITY): Payer: Self-pay | Admitting: Registered Nurse

## 2017-09-12 DIAGNOSIS — K8010 Calculus of gallbladder with chronic cholecystitis without obstruction: Secondary | ICD-10-CM | POA: Diagnosis not present

## 2017-09-12 DIAGNOSIS — K810 Acute cholecystitis: Secondary | ICD-10-CM | POA: Diagnosis not present

## 2017-09-12 DIAGNOSIS — K8000 Calculus of gallbladder with acute cholecystitis without obstruction: Secondary | ICD-10-CM | POA: Diagnosis not present

## 2017-09-12 HISTORY — PX: CHOLECYSTECTOMY: SHX55

## 2017-09-12 LAB — SURGICAL PCR SCREEN
MRSA, PCR: NEGATIVE
STAPHYLOCOCCUS AUREUS: NEGATIVE

## 2017-09-12 LAB — HIV ANTIBODY (ROUTINE TESTING W REFLEX): HIV SCREEN 4TH GENERATION: NONREACTIVE

## 2017-09-12 SURGERY — LAPAROSCOPIC CHOLECYSTECTOMY WITH INTRAOPERATIVE CHOLANGIOGRAM
Anesthesia: General | Site: Abdomen

## 2017-09-12 MED ORDER — SIMETHICONE 80 MG PO CHEW: 40.0000 mg | CHEWABLE_TABLET | Freq: Four times a day (QID) | ORAL | Status: DC | PRN

## 2017-09-12 MED ORDER — HYDROMORPHONE HCL 1 MG/ML IJ SOLN
0.2500 mg | INTRAMUSCULAR | Status: DC | PRN
  Administered 2017-09-12 (×4): 0.5 mg via INTRAVENOUS

## 2017-09-12 MED ORDER — SUGAMMADEX SODIUM 200 MG/2ML IV SOLN
INTRAVENOUS | Status: AC
  Filled 2017-09-12: qty 2

## 2017-09-12 MED ORDER — ONDANSETRON HCL 4 MG/2ML IJ SOLN
INTRAMUSCULAR | Status: AC
  Filled 2017-09-12: qty 2

## 2017-09-12 MED ORDER — SUGAMMADEX SODIUM 200 MG/2ML IV SOLN
INTRAVENOUS | Status: DC | PRN
  Administered 2017-09-12: 200 mg via INTRAVENOUS

## 2017-09-12 MED ORDER — FAMOTIDINE IN NACL 20-0.9 MG/50ML-% IV SOLN
20.0000 mg | Freq: Two times a day (BID) | INTRAVENOUS | Status: DC
  Administered 2017-09-12: 20 mg via INTRAVENOUS
  Filled 2017-09-12 (×2): qty 50

## 2017-09-12 MED ORDER — GABAPENTIN 300 MG PO CAPS
300.0000 mg | ORAL_CAPSULE | Freq: Once | ORAL | Status: AC
  Administered 2017-09-12: 300 mg via ORAL
  Filled 2017-09-12: qty 1

## 2017-09-12 MED ORDER — ONDANSETRON 4 MG PO TBDP: 4.0000 mg | ORAL_TABLET | Freq: Four times a day (QID) | ORAL | Status: DC | PRN

## 2017-09-12 MED ORDER — BUPIVACAINE-EPINEPHRINE 0.25% -1:200000 IJ SOLN
INTRAMUSCULAR | Status: DC | PRN
  Administered 2017-09-12: 23 mL

## 2017-09-12 MED ORDER — LIDOCAINE 2% (20 MG/ML) 5 ML SYRINGE
INTRAMUSCULAR | Status: DC | PRN
  Administered 2017-09-12: 60 mg via INTRAVENOUS

## 2017-09-12 MED ORDER — LACTATED RINGERS IR SOLN
Status: DC | PRN
  Administered 2017-09-12: 2000 mL

## 2017-09-12 MED ORDER — SENNOSIDES-DOCUSATE SODIUM 8.6-50 MG PO TABS
1.0000 | ORAL_TABLET | Freq: Every day | ORAL | Status: DC
  Administered 2017-09-12 – 2017-09-13 (×2): 1 via ORAL
  Filled 2017-09-12 (×2): qty 1

## 2017-09-12 MED ORDER — HYDROMORPHONE HCL 1 MG/ML IJ SOLN
INTRAMUSCULAR | Status: AC
  Filled 2017-09-12: qty 1

## 2017-09-12 MED ORDER — IOPAMIDOL (ISOVUE-300) INJECTION 61%
INTRAVENOUS | Status: AC
  Filled 2017-09-12: qty 50

## 2017-09-12 MED ORDER — ONDANSETRON HCL 4 MG/2ML IJ SOLN: 4.0000 mg | Freq: Four times a day (QID) | INTRAMUSCULAR | Status: DC | PRN

## 2017-09-12 MED ORDER — ROCURONIUM BROMIDE 10 MG/ML (PF) SYRINGE
PREFILLED_SYRINGE | INTRAVENOUS | Status: AC
  Filled 2017-09-12: qty 10

## 2017-09-12 MED ORDER — PROPOFOL 10 MG/ML IV BOLUS
INTRAVENOUS | Status: DC | PRN
  Administered 2017-09-12: 160 mg via INTRAVENOUS

## 2017-09-12 MED ORDER — ROCURONIUM BROMIDE 10 MG/ML (PF) SYRINGE
PREFILLED_SYRINGE | INTRAVENOUS | Status: DC | PRN
  Administered 2017-09-12: 10 mg via INTRAVENOUS
  Administered 2017-09-12: 50 mg via INTRAVENOUS
  Administered 2017-09-12: 10 mg via INTRAVENOUS

## 2017-09-12 MED ORDER — 0.9 % SODIUM CHLORIDE (POUR BTL) OPTIME
TOPICAL | Status: DC | PRN
  Administered 2017-09-12: 1000 mL

## 2017-09-12 MED ORDER — FENTANYL CITRATE (PF) 250 MCG/5ML IJ SOLN
INTRAMUSCULAR | Status: DC | PRN
  Administered 2017-09-12 (×2): 50 ug via INTRAVENOUS
  Administered 2017-09-12: 150 ug via INTRAVENOUS

## 2017-09-12 MED ORDER — HYDROCODONE-ACETAMINOPHEN 5-325 MG PO TABS
1.0000 | ORAL_TABLET | ORAL | Status: DC | PRN
  Administered 2017-09-12 (×2): 1 via ORAL
  Administered 2017-09-13: 2 via ORAL
  Administered 2017-09-13 – 2017-09-14 (×3): 1 via ORAL
  Filled 2017-09-12 (×4): qty 1
  Filled 2017-09-12: qty 2
  Filled 2017-09-12: qty 1

## 2017-09-12 MED ORDER — DEXAMETHASONE SODIUM PHOSPHATE 10 MG/ML IJ SOLN
INTRAMUSCULAR | Status: AC
  Filled 2017-09-12: qty 1

## 2017-09-12 MED ORDER — ENOXAPARIN SODIUM 40 MG/0.4ML ~~LOC~~ SOLN
40.0000 mg | SUBCUTANEOUS | Status: DC
  Administered 2017-09-13 – 2017-09-14 (×2): 40 mg via SUBCUTANEOUS
  Filled 2017-09-12 (×2): qty 0.4

## 2017-09-12 MED ORDER — SODIUM CHLORIDE 0.9 % IV SOLN
2.0000 g | Freq: Every day | INTRAVENOUS | Status: DC
  Filled 2017-09-12 (×2): qty 20

## 2017-09-12 MED ORDER — ACETAMINOPHEN 500 MG PO TABS
1000.0000 mg | ORAL_TABLET | Freq: Four times a day (QID) | ORAL | Status: DC
  Administered 2017-09-12 – 2017-09-14 (×4): 1000 mg via ORAL
  Filled 2017-09-12 (×5): qty 2

## 2017-09-12 MED ORDER — BUPIVACAINE-EPINEPHRINE (PF) 0.25% -1:200000 IJ SOLN
INTRAMUSCULAR | Status: AC
  Filled 2017-09-12: qty 30

## 2017-09-12 MED ORDER — BISACODYL 10 MG RE SUPP: 10.0000 mg | Freq: Every day | RECTAL | Status: DC | PRN

## 2017-09-12 MED ORDER — HYDROCODONE-ACETAMINOPHEN 5-325 MG PO TABS: 1.0000 | ORAL_TABLET | ORAL | Status: DC | PRN

## 2017-09-12 MED ORDER — CELECOXIB 200 MG PO CAPS
200.0000 mg | ORAL_CAPSULE | Freq: Once | ORAL | Status: AC
  Administered 2017-09-12: 200 mg via ORAL
  Filled 2017-09-12: qty 1

## 2017-09-12 MED ORDER — MORPHINE SULFATE (PF) 2 MG/ML IV SOLN: 1.0000 mg | INTRAVENOUS | Status: DC | PRN

## 2017-09-12 MED ORDER — ONDANSETRON HCL 4 MG/2ML IJ SOLN
INTRAMUSCULAR | Status: DC | PRN
  Administered 2017-09-12: 4 mg via INTRAVENOUS

## 2017-09-12 MED ORDER — FENTANYL CITRATE (PF) 100 MCG/2ML IJ SOLN: 25.0000 ug | INTRAMUSCULAR | Status: DC | PRN

## 2017-09-12 MED ORDER — KCL IN DEXTROSE-NACL 20-5-0.45 MEQ/L-%-% IV SOLN
INTRAVENOUS | Status: DC
  Administered 2017-09-12: 17:00:00 via INTRAVENOUS
  Filled 2017-09-12 (×2): qty 1000

## 2017-09-12 MED ORDER — FENTANYL CITRATE (PF) 250 MCG/5ML IJ SOLN
INTRAMUSCULAR | Status: AC
  Filled 2017-09-12: qty 5

## 2017-09-12 MED ORDER — DIPHENHYDRAMINE HCL 50 MG/ML IJ SOLN: 25.0000 mg | Freq: Four times a day (QID) | INTRAMUSCULAR | Status: DC | PRN

## 2017-09-12 MED ORDER — MIDAZOLAM HCL 5 MG/5ML IJ SOLN
INTRAMUSCULAR | Status: DC | PRN
  Administered 2017-09-12: 2 mg via INTRAVENOUS

## 2017-09-12 MED ORDER — OXYCODONE HCL 5 MG PO TABS: 5.0000 mg | ORAL_TABLET | Freq: Once | ORAL | Status: DC | PRN

## 2017-09-12 MED ORDER — MIDAZOLAM HCL 2 MG/2ML IJ SOLN
INTRAMUSCULAR | Status: AC
  Filled 2017-09-12: qty 2

## 2017-09-12 MED ORDER — LACTATED RINGERS IV SOLN
INTRAVENOUS | Status: DC
  Administered 2017-09-12: 1000 mL via INTRAVENOUS
  Administered 2017-09-12: 14:00:00 via INTRAVENOUS

## 2017-09-12 MED ORDER — PIPERACILLIN-TAZOBACTAM 3.375 G IVPB
INTRAVENOUS | Status: AC
  Filled 2017-09-12: qty 50

## 2017-09-12 MED ORDER — KCL IN DEXTROSE-NACL 20-5-0.45 MEQ/L-%-% IV SOLN
INTRAVENOUS | Status: DC
  Administered 2017-09-12: 10:00:00 via INTRAVENOUS
  Filled 2017-09-12: qty 1000

## 2017-09-12 MED ORDER — MORPHINE SULFATE (PF) 2 MG/ML IV SOLN: 2.0000 mg | INTRAVENOUS | Status: DC | PRN

## 2017-09-12 MED ORDER — PIPERACILLIN-TAZOBACTAM 3.375 G IVPB
3.3750 g | Freq: Three times a day (TID) | INTRAVENOUS | Status: DC
  Administered 2017-09-12 – 2017-09-14 (×6): 3.375 g via INTRAVENOUS
  Filled 2017-09-12 (×5): qty 50

## 2017-09-12 MED ORDER — LIDOCAINE 2% (20 MG/ML) 5 ML SYRINGE
INTRAMUSCULAR | Status: AC
  Filled 2017-09-12: qty 5

## 2017-09-12 MED ORDER — OXYCODONE HCL 5 MG/5ML PO SOLN
5.0000 mg | Freq: Once | ORAL | Status: DC | PRN
  Filled 2017-09-12: qty 5

## 2017-09-12 MED ORDER — DIPHENHYDRAMINE HCL 25 MG PO CAPS: 25.0000 mg | ORAL_CAPSULE | Freq: Four times a day (QID) | ORAL | Status: DC | PRN

## 2017-09-12 MED ORDER — DEXAMETHASONE SODIUM PHOSPHATE 10 MG/ML IJ SOLN
INTRAMUSCULAR | Status: DC | PRN
  Administered 2017-09-12: 10 mg via INTRAVENOUS

## 2017-09-12 SURGICAL SUPPLY — 43 items
ADH SKN CLS APL DERMABOND .7 (GAUZE/BANDAGES/DRESSINGS) ×1
APPLIER CLIP 5 13 M/L LIGAMAX5 (MISCELLANEOUS) ×2
APR CLP MED LRG 5 ANG JAW (MISCELLANEOUS) ×1
BAG SPEC RTRVL 10 TROC 200 (ENDOMECHANICALS) ×1
BAG SPEC RTRVL LRG 6X4 10 (ENDOMECHANICALS) ×1
CABLE HIGH FREQUENCY MONO STRZ (ELECTRODE) ×2 IMPLANT
CHLORAPREP W/TINT 26ML (MISCELLANEOUS) ×2 IMPLANT
CLIP APPLIE 5 13 M/L LIGAMAX5 (MISCELLANEOUS) ×1 IMPLANT
COVER SURGICAL LIGHT HANDLE (MISCELLANEOUS) ×1 IMPLANT
DERMABOND ADVANCED (GAUZE/BANDAGES/DRESSINGS) ×1
DERMABOND ADVANCED .7 DNX12 (GAUZE/BANDAGES/DRESSINGS) ×1 IMPLANT
DEVICE TROCAR PUNCTURE CLOSURE (ENDOMECHANICALS) IMPLANT
DRAIN CHANNEL 19F RND (DRAIN) ×1 IMPLANT
ELECT REM PT RETURN 15FT ADLT (MISCELLANEOUS) ×2 IMPLANT
EVACUATOR SILICONE 100CC (DRAIN) ×1 IMPLANT
GLOVE BIO SURGEON STRL SZ 6.5 (GLOVE) ×2 IMPLANT
GLOVE BIOGEL PI IND STRL 7.0 (GLOVE) ×1 IMPLANT
GLOVE BIOGEL PI INDICATOR 7.0 (GLOVE) ×1
GOWN STRL REUS W/TWL 2XL LVL3 (GOWN DISPOSABLE) ×2 IMPLANT
GOWN STRL REUS W/TWL XL LVL3 (GOWN DISPOSABLE) ×4 IMPLANT
IRRIG SUCT STRYKERFLOW 2 WTIP (MISCELLANEOUS) ×2
IRRIGATION SUCT STRKRFLW 2 WTP (MISCELLANEOUS) ×1 IMPLANT
IV CATH 14GX2 1/4 (CATHETERS) IMPLANT
KIT BASIN OR (CUSTOM PROCEDURE TRAY) ×2 IMPLANT
POUCH RETRIEVAL ECOSAC 10 (ENDOMECHANICALS) IMPLANT
POUCH RETRIEVAL ECOSAC 10MM (ENDOMECHANICALS) ×1
POUCH SPECIMEN RETRIEVAL 10MM (ENDOMECHANICALS) ×2 IMPLANT
SCISSORS LAP 5X35 DISP (ENDOMECHANICALS) ×2 IMPLANT
SLEEVE XCEL OPT CAN 5 100 (ENDOMECHANICALS) ×4 IMPLANT
SPONGE DRAIN TRACH 4X4 STRL 2S (GAUZE/BANDAGES/DRESSINGS) ×1 IMPLANT
SUT ETHILON 2 0 PS N (SUTURE) ×1 IMPLANT
SUT MNCRL AB 4-0 PS2 18 (SUTURE) ×1 IMPLANT
SUT VIC AB 2-0 SH 27 (SUTURE)
SUT VIC AB 2-0 SH 27X BRD (SUTURE) IMPLANT
SUT VIC AB 4-0 PS2 18 (SUTURE) ×2 IMPLANT
SUT VICRYL 0 UR6 27IN ABS (SUTURE) ×1 IMPLANT
TOWEL OR 17X26 10 PK STRL BLUE (TOWEL DISPOSABLE) ×2 IMPLANT
TOWEL OR NON WOVEN STRL DISP B (DISPOSABLE) ×2 IMPLANT
TRAY LAPAROSCOPIC (CUSTOM PROCEDURE TRAY) ×2 IMPLANT
TROCAR BLADELESS OPT 5 100 (ENDOMECHANICALS) ×2 IMPLANT
TROCAR XCEL BLUNT TIP 100MML (ENDOMECHANICALS) IMPLANT
TROCAR XCEL NON-BLD 11X100MML (ENDOMECHANICALS) IMPLANT
TUBING INSUF HEATED (TUBING) ×2 IMPLANT

## 2017-09-12 NOTE — Anesthesia Procedure Notes (Signed)
Procedure Name: Intubation Date/Time: 09/12/2017 1:07 PM Performed by: Talbot Grumbling, CRNA Pre-anesthesia Checklist: Patient identified, Emergency Drugs available, Suction available and Patient being monitored Patient Re-evaluated:Patient Re-evaluated prior to induction Oxygen Delivery Method: Circle system utilized Preoxygenation: Pre-oxygenation with 100% oxygen Induction Type: IV induction Ventilation: Mask ventilation without difficulty Laryngoscope Size: Mac and 3 Grade View: Grade II Tube type: Oral Tube size: 8.0 mm Number of attempts: 1 Airway Equipment and Method: Stylet Placement Confirmation: ETT inserted through vocal cords under direct vision,  positive ETCO2 and breath sounds checked- equal and bilateral Secured at: 22 cm Tube secured with: Tape Dental Injury: Teeth and Oropharynx as per pre-operative assessment

## 2017-09-12 NOTE — ED Notes (Signed)
ED TO INPATIENT HANDOFF REPORT  Name/Age/Gender Ryan Sutton 42 y.o. male  Code Status   Home/SNF/Other Home  Chief Complaint PCP sent pt for Cholecystitis  Level of Care/Admitting Diagnosis ED Disposition    ED Disposition Condition [place] [facility] Area: [place] [[id number]]  Level of Care: Med-Surg [16]  Diagnosis: Cholecystitis with cholelithiasis [267124]  Admitting Physician: Jovita Kussmaul [phone]  Attending Physician: [facility], MD [[id number]  PT Class (Do Not Modify): Observation [104]  PT Acc Code (Do Not Modify): Observation [10022]       Medical History History reviewed. No pertinent past medical history.  Allergies Allergies  Allergen Reactions  . Latex Rash  . Shellfish Allergy Swelling    IV Location/Drains/Wounds Patient Lines/Drains/Airways Status   Active Line/Drains/Airways    Name:   Placement date:   Placement time:   Site:   Days:   Peripheral IV 09/11/17 Left Antecubital   09/11/17    2105    Antecubital   1          Labs/Imaging Results for orders placed or performed during the hospital encounter of 09/11/17 (from the past 48 hour(s))  CBC     Status: Abnormal   Collection Time: 09/11/17  7:45 PM  Result Value Ref Range   WBC 16.4 (H) 4.0 - 10.5 K/uL   RBC 3.99 (L) 4.22 - 5.81 MIL/uL   Hemoglobin 12.7 (L) 13.0 - 17.0 g/dL   HCT 37.3 (L) 39.0 - 52.0 %   MCV 93.5 78.0 - 100.0 fL   MCH 31.8 26.0 - 34.0 pg   MCHC 34.0 30.0 - 36.0 g/dL   RDW 12.4 11.5 - 15.5 %   Platelets 175 150 - 400 K/uL    Comment: Performed at [facility], [place] [address] [place], [place] [address]  Comprehensive metabolic panel     Status: Abnormal   Collection Time: 09/11/17  7:45 PM  Result Value Ref Range   Sodium 137 135 - 145 mmol/L   Potassium 4.0 3.5 - 5.1 mmol/L   Chloride 100 98 - 111 mmol/L   CO2 30 22 - 32 mmol/L   Glucose, Bld 120 (H) 70 - 99 mg/dL   BUN 11 6 - 20 mg/dL   Creatinine, Ser 1.07 0.61 -  1.24 mg/dL   Calcium 9.0 8.9 - 10.3 mg/dL   Total Protein 7.3 6.5 - 8.1 g/dL   Albumin 3.6 3.5 - 5.0 g/dL   AST 140 (H) 15 - 41 U/L   ALT 200 (H) 0 - 44 U/L   Alkaline Phosphatase 106 38 - 126 U/L   Total Bilirubin 0.8 0.3 - 1.2 mg/dL   GFR calc non Af Amer >60 >60 mL/min   GFR calc Af Amer >60 >60 mL/min    Comment: (NOTE) The eGFR has been calculated using the CKD EPI equation. This calculation has not been validated in all clinical situations. eGFR's persistently <60 mL/min signify possible Chronic Kidney Disease.    Anion gap 7 5 - 15    Comment: Performed at [facility], [place] [address], [place], [place] [address]   No results found.  Pending Labs [employer] (From admission, onward)    Start     Ordered   Signed and Held  HIV antibody (Routine Testing)  Once,   R     Signed and Held          Vitals/Pain Today's Vitals   09/11/17 2053 09/11/17  2055 09/11/17 2212 09/12/17 0010  BP:  123/67 115/62   Pulse:  83 80   Resp:  18 16   Temp:  100.1 F (37.8 C) (!) 100.4 F (38 C)   TempSrc:  Oral Oral   SpO2:  97% 98%   Weight:      Height:      PainSc: 4    4     Isolation Precautions No active isolations  Medications Medications  0.9 %  sodium chloride infusion ( Intravenous New Bag/Given 09/11/17 2107)  cefTRIAXone (ROCEPHIN) 2 g in sodium chloride 0.9 % 100 mL IVPB (0 g Intravenous Stopped 09/11/17 2138)  acetaminophen (TYLENOL) tablet 650 mg (650 mg Oral Given 09/11/17 2116)    Mobility walks

## 2017-09-12 NOTE — Anesthesia Preprocedure Evaluation (Addendum)
Anesthesia Evaluation  Patient identified by MRN, date of birth, ID band Patient awake    Reviewed: Allergy & Precautions, NPO status , Patient's Chart, lab work & pertinent test results  History of Anesthesia Complications Negative for: history of anesthetic complications  Airway Mallampati: II  TM Distance: >3 FB Neck ROM: Full    Dental  (+) Teeth Intact   Pulmonary neg shortness of breath, neg sleep apnea, neg COPD, neg recent URI, former smoker,    breath sounds clear to auscultation       Cardiovascular negative cardio ROS   Rhythm:Regular     Neuro/Psych negative neurological ROS  negative psych ROS   GI/Hepatic Neg liver ROS, cholecystitis   Endo/Other  negative endocrine ROS  Renal/GU negative Renal ROS     Musculoskeletal negative musculoskeletal ROS (+)   Abdominal   Peds  Hematology negative hematology ROS (+)   Anesthesia Other Findings   Reproductive/Obstetrics                            Anesthesia Physical Anesthesia Plan  ASA: II  Anesthesia Plan: General   Post-op Pain Management:    Induction: Intravenous  PONV Risk Score and Plan: 2 and Ondansetron and Dexamethasone  Airway Management Planned: Oral ETT  Additional Equipment: None  Intra-op Plan:   Post-operative Plan: Extubation in OR  Informed Consent: I have reviewed the patients History and Physical, chart, labs and discussed the procedure including the risks, benefits and alternatives for the proposed anesthesia with the patient or authorized representative who has indicated his/her understanding and acceptance.   Dental advisory given  Plan Discussed with: CRNA and Surgeon  Anesthesia Plan Comments:         Anesthesia Quick Evaluation

## 2017-09-12 NOTE — Op Note (Signed)
09/12/2017  2:20 PM  PATIENT:  Ryan Sutton  42 y.o. male  Patient Care Team: Hannah Beat, MD as PCP - General (Family Medicine)  PRE-OPERATIVE DIAGNOSIS:  cholecystitis  POST-OPERATIVE DIAGNOSIS:  Gangrenous cholecystitis  PROCEDURE:  LAPAROSCOPIC CHOLECYSTECTOMY  Surgeon(s): Romie Levee, MD  ASSISTANT: Barnetta Chapel, PA   ANESTHESIA:   local and general  EBL:26ml  Total I/O In: 1203.2 [I.V.:1153.2; IV Piggyback:50] Out: 625 [Urine:600; Blood:25]  DRAINS: (39F) Jackson-Pratt drain(s) with closed bulb suction in the RUQ   SPECIMEN:  Source of Specimen:  gallbladder  DISPOSITION OF SPECIMEN:  PATHOLOGY  COUNTS:  YES  PLAN OF CARE: Pt already admitted  PATIENT DISPOSITION:  PACU - hemodynamically stable.  INDICATION: 42 y.o. M with RUQ pain for 4 days.  US shows cholecystitis   The anatomy & physiology of hepatobiliary & pancreatic function was discussed.  The pathophysiology of gallbladder dysfunction was discussed.  Natural history risks without surgery was discussed.   I feel the risks of no intervention will lead to serious problems that outweigh the operative risks; therefore, I recommended cholecystectomy to remove the pathology.  I explained laparoscopic techniques with possible need for an open approach.  Probable cholangiogram to evaluate the bilary tract was explained as well.    Risks such as bleeding, infection, abscess, leak, injury to other organs, need for further treatment, heart attack, death, and other risks were discussed.  I noted a good likelihood this will help address the problem.  Possibility that this will not correct all abdominal symptoms was explained.  Goals of post-operative recovery were discussed as well.    OR FINDINGS: gangrenous gallbladder  DESCRIPTION:   The patient was identified & brought into the operating room. The patient was positioned supine with arms tucked. SCDs were active during the entire case. The patient  underwent general anesthesia without any difficulty.  The abdomen was prepped and draped in a sterile fashion. A Surgical Timeout was performed and confirmed our plan.  We positioned the patient in reverse [place] & right side up.  I placed a Hassan laparoscopic port through the umbilicus using open entry technique.  Entry was clean. There were no adhesions to the anterior abdominal wall supraumbilically.  We induced carbon dioxide insufflation. Camera inspection revealed no injury.    I proceeded to continue with laparoscopic technique. I placed a 5 mm port in mid subcostal region, another 5mm port in the right flank near the anterior axillary line, and a 5mm port in the left subxiphoid region obliquely within the falciform ligament.  I turned attention to the right upper quadrant.  The gallbladder fundus was elevated cephalad. I used cautery and blunt dissection to free the peritoneal coverings between the gallbladder and the liver on the posteriolateral and anteriomedial walls.   I used careful blunt and cautery dissection with a maryland dissector to help get a good critical view of the cystic artery and cystic duct. I did further dissection to free a few centimeters of the  gallbladder off the liver bed to get a good critical view of the infundibulum and cystic duct. I mobilized the cystic artery.  I skeletonized the cystic duct.  After getting a good 360 view, I decided not to perform a cholangiogram.  I placed a clip on the infundibulum.  I placed clips on the cystic duct x3.  I completed cystic duct transection.   I placed clips on the cystic artery x3 with 2 proximally.  I ligated the cystic artery using scissors.  I freed the gallbladder from its remaining attachments to the liver. I ensured hemostasis on the gallbladder fossa of the liver and elsewhere. I inspected the rest of the abdomen & detected no injury nor bleeding elsewhere.  I irrigated the RUQ with normal saline.  I removed the  gallbladder through the umbilical port site.  The laparoscopic bag broke in the wound and an Ecosac had to be used to retrieve the bag and the gallbladder.  I closed the umbilical fascia using 0 Vicryl stitches.   I closed the skin using 4-0 vicryl stitch.  Sterile dressings were applied. The patient was extubated & arrived in the PACU in stable condition.  I had discussed postoperative care with the patient in the holding area.  I will discuss operative findings and postoperative goals / instructions with the patient's family.  Instructions are written in the chart as well.

## 2017-09-12 NOTE — Progress Notes (Signed)
Cholecystitis  Subjective: Still having RUQ pain  Objective: Vital signs in last 24 hours: Temp:  [98.8 F (37.1 C)-100.5 F (38.1 C)] 100 F (37.8 C) (09/12 0647) Pulse Rate:  [74-83] 74 (09/12 0647) Resp:  [16-18] 18 (09/12 0647) BP: (104-137)/(62-79) 107/62 (09/12 0647) SpO2:  [97 %-100 %] 99 % (09/12 0647) Weight:  [83.9 kg-84.1 kg] 83.9 kg (09/11 1721) Last BM Date: 09/10/17  Intake/Output from previous day: 09/11 0701 - 09/12 0700 In: 50 [IV Piggyback:50] Out: -  Intake/Output this shift: No intake/output data recorded.  General appearance: alert and cooperative GI: normal findings: TTP RUQ  Lab Results:  Results for orders placed or performed during the hospital encounter of 09/11/17 (from the past 24 hour(s))  CBC     Status: Abnormal   Collection Time: 09/11/17  7:45 PM  Result Value Ref Range   WBC 16.4 (H) 4.0 - 10.5 K/uL   RBC 3.99 (L) 4.22 - 5.81 MIL/uL   Hemoglobin 12.7 (L) 13.0 - 17.0 g/dL   HCT 16.137.3 (L) 09.639.0 - 04.552.0 %   MCV 93.5 78.0 - 100.0 fL   MCH 31.8 26.0 - 34.0 pg   MCHC 34.0 30.0 - 36.0 g/dL   RDW 40.912.4 81.111.5 - 91.415.5 %   Platelets 175 150 - 400 K/uL  Comprehensive metabolic panel     Status: Abnormal   Collection Time: 09/11/17  7:45 PM  Result Value Ref Range   Sodium 137 135 - 145 mmol/L   Potassium 4.0 3.5 - 5.1 mmol/L   Chloride 100 98 - 111 mmol/L   CO2 30 22 - 32 mmol/L   Glucose, Bld 120 (H) 70 - 99 mg/dL   BUN 11 6 - 20 mg/dL   Creatinine, Ser 7.821.07 0.61 - 1.24 mg/dL   Calcium 9.0 8.9 - 95.610.3 mg/dL   Total Protein 7.3 6.5 - 8.1 g/dL   Albumin 3.6 3.5 - 5.0 g/dL   AST 213140 (H) 15 - 41 U/L   ALT 200 (H) 0 - 44 U/L   Alkaline Phosphatase 106 38 - 126 U/L   Total Bilirubin 0.8 0.3 - 1.2 mg/dL   GFR calc non Af Amer >60 >60 mL/min   GFR calc Af Amer >60 >60 mL/min   Anion gap 7 5 - 15     Studies/Results Radiology     MEDS, Scheduled    Assessment: Pt with cholecystitis.    Plan: Plan for lap chole today with possible  IOC.  The anatomy & physiology of hepatobiliary & pancreatic function was discussed.  The pathophysiology of gallbladder dysfunction was discussed.  Natural history risks without surgery was discussed.   I feel the risks of no intervention will lead to serious problems that outweigh the operative risks; therefore, I recommended cholecystectomy to remove the pathology.  I explained laparoscopic techniques with possible need for an open approach.  Probable cholangiogram to evaluate the bilary tract was explained as well.    Risks such as bleeding, infection, abscess, leak, injury to other organs, need for further treatment, heart attack, death, and other risks were discussed.  I noted a good likelihood this will help address the problem.  Possibility that this will not correct all abdominal symptoms was explained.  Goals of post-operative recovery were discussed as well.  We will work to minimize complications.  An educational handout further explaining the pathology and treatment options was given as well.  Questions were answered.  The patient expresses understanding & wishes to proceed with surgery.  LOS: 0 days    Ryan Panda, MD [facility], [place] [phone]   09/12/2017 9:44 AM

## 2017-09-12 NOTE — ED Notes (Signed)
Gave report to Annice PihJackie, Charity fundraiserN for room 1536.

## 2017-09-12 NOTE — Transfer of Care (Signed)
Immediate Anesthesia Transfer of Care Note  Patient: Ryan Sutton  Procedure(s) Performed: LAPAROSCOPIC CHOLECYSTECTOMY (N/A Abdomen)  Patient Location: PACU  Anesthesia Type:General  Level of Consciousness: awake, alert  and oriented  Airway & Oxygen Therapy: Patient Spontanous Breathing and Patient connected to face mask oxygen  Post-op Assessment: Report given to RN and Post -op Vital signs reviewed and stable  Post vital signs: Reviewed and stable  Last Vitals:  Vitals Value Taken Time  BP 140/66 09/12/2017  2:25 PM  Temp    Pulse 79 09/12/2017  2:27 PM  Resp 16 09/12/2017  2:27 PM  SpO2 100 % 09/12/2017  2:27 PM  Vitals shown include unvalidated device data.  Last Pain:  Vitals:   09/12/17 1221  TempSrc:   PainSc: 3       Patients Stated Pain Goal: 4 (09/12/17 1221)  Complications: No apparent anesthesia complications

## 2017-09-13 ENCOUNTER — Encounter (HOSPITAL_COMMUNITY): Payer: Self-pay | Admitting: General Surgery

## 2017-09-13 NOTE — Discharge Instructions (Signed)
Please arrive at least 30 min before your appointment to complete your check in paperwork.  If you are unable to arrive 30 min prior to your appointment time we may have to cancel or reschedule you.  LAPAROSCOPIC SURGERY: POST OP INSTRUCTIONS  1. DIET: Follow a light bland diet the first 24 hours after arrival home, such as soup, liquids, crackers, etc. Be sure to include lots of fluids daily. Avoid fast food or heavy meals as your are more likely to get nauseated. Eat a low fat the next few days after surgery.  2. Take your usually prescribed home medications unless otherwise directed. 3. PAIN CONTROL:  1. Pain is best controlled by a usual combination of three different methods TOGETHER:  i. Ice/Heat ii. Over the counter pain medication iii. Prescription pain medication 2. Most patients will experience some swelling and bruising around the incisions. Ice packs or heating pads (30-60 minutes up to 6 times a day) will help. Use ice for the first few days to help decrease swelling and bruising, then switch to heat to help relax tight/sore spots and speed recovery. Some people prefer to use ice alone, heat alone, alternating between ice & heat. Experiment to what works for you. Swelling and bruising can take several weeks to resolve.  3. It is helpful to take an over-the-counter pain medication regularly for the first few weeks. Choose one of the following that works best for you:  i. Naproxen (Aleve, etc) Two 220mg  tabs twice a day ii. Ibuprofen (Advil, etc) Three 200mg  tabs four times a day (every meal & bedtime) iii. Acetaminophen (Tylenol, etc) 500-650mg  four times a day (every meal & bedtime) 4. A prescription for pain medication (such as oxycodone, hydrocodone, etc) should be given to you upon discharge. Take your pain medication as prescribed.  i. If you are having problems/concerns with the prescription medicine (does not control pain, nausea, vomiting, rash, itching, etc), please call us [phone]  [phone] to see if we need to switch you to a different pain medicine that will work better for you and/or control your side effect better. ii. If you need a refill on your pain medication, please contact your pharmacy. They will contact our office to request authorization. Prescriptions will not be filled after 5 pm or on week-ends. 1. Avoid getting constipated. Between the surgery and the pain medications, it is common to experience some constipation. Increasing fluid intake and taking a fiber supplement (such as Metamucil, Citrucel, FiberCon, MiraLax, etc) 1-2 times a day regularly will usually help prevent this problem from occurring. A mild laxative (prune juice, Milk of Magnesia, MiraLax, etc) should be taken according to package directions if there are no bowel movements after 48 hours.  2. Watch out for diarrhea. If you have many loose bowel movements, simplify your diet to bland foods & liquids for a few days. Stop any stool softeners and decrease your fiber supplement. Switching to mild anti-diarrheal medications (Kayopectate, Pepto Bismol) can help. If this worsens or does not improve, please call us. 3. Wash / shower every day. You may shower over the dressings as they are waterproof. Continue to shower over incision(s) after the dressing is off. 4. Remove your waterproof bandages 5 days after surgery. You may leave the incision open to air. You may replace a dressing/Band-Aid to cover the incision for comfort if you wish.  5. ACTIVITIES as tolerated:  a. You may resume regular (light) daily activities beginning the next day--such as daily self-care, walking, climbing stairs--gradually  increasing activities as tolerated. If you can walk 30 minutes without difficulty, it is safe to try more intense activity such as jogging, treadmill, bicycling, low-impact aerobics, swimming, etc. °b. Save the most intensive and strenuous activity for last such as sit-ups, heavy lifting, contact sports, etc Refrain  from any heavy lifting or straining until you are off narcotics for pain control.  °c. DO NOT PUSH THROUGH PAIN. Let pain be your guide: If it hurts to do something, don't do it. Pain is your body warning you to avoid that activity for another week until the pain goes down. °d. You may drive when you are no longer taking prescription pain medication, you can comfortably wear a seatbelt, and you can safely maneuver your car and apply brakes. °e. You may have sexual intercourse when it is comfortable.  °6. FOLLOW UP in our office  °a. Please call [facility] at [phone] to set up an appointment to see your surgeon in the office for a follow-up appointment approximately 2-3 weeks after your surgery. °b. Make sure that you call for this appointment the day you arrive home to insure a convenient appointment time. °     10. IF YOU HAVE DISABILITY OR FAMILY LEAVE FORMS, BRING THEM TO THE               OFFICE FOR PROCESSING.  ° °WHEN TO CALL US [phone]:  °1. Poor pain control °2. Reactions / problems with new medications (rash/itching, nausea, etc)  °3. Fever over 101.5 F (38.5 C) °4. Inability to urinate °5. Nausea and/or vomiting °6. Worsening swelling or bruising °7. Continued bleeding from incision. °8. Increased pain, redness, or drainage from the incision ° °The clinic staff is available to answer your questions during regular business hours (8:30am-5pm). Please don’t hesitate to call and ask to speak to one of our nurses for clinical concerns.  °If you have a medical emergency, go to the nearest emergency room or call 911.  °A surgeon from [facility] is always on call at the hospitals  ° °[facility]  °[address], [address], [place], [place] [address] ?  °MAIN: [phone] ? TOLL FREE: [phone] ?  °FAX [phone]  °[email or web address] ° °

## 2017-09-13 NOTE — Progress Notes (Signed)
Changed JP drain dressing at 1940.  Dressing had moderate red drainage on old dressing. No drainage noted from site while bandage was off. Drain pulled out of incision about 3/4 in. Current RN notified. 7:46 PM Ryan DadaHaley E Chananya Sutton 09/13/17

## 2017-09-13 NOTE — Progress Notes (Signed)
1 Day Post-Op lap chole Subjective: Pt doing well, tolerating clears, pain controlled  Objective: Vital signs in last 24 hours: Temp:  [97.8 F (36.6 C)-100.3 F (37.9 C)] 97.8 F (36.6 C) (09/13 0424) Pulse Rate:  [58-80] 58 (09/13 0424) Resp:  [15-18] 16 (09/13 0424) BP: (123-143)/(64-78) 131/74 (09/13 0424) SpO2:  [93 %-100 %] 99 % (09/13 0424) Weight:  [83.9 kg] 83.9 kg (09/12 1221)   Intake/Output from previous day: 09/12 0701 - 09/13 0700 In: 3027.3 [P.O.:1160; I.V.:1776.3; IV Piggyback:91] Out: 2240 [Urine:2100; Drains:115; Blood:25] Intake/Output this shift: No intake/output data recorded.   General appearance: alert and cooperative GI: normal findings: soft, appropriately tender  Incision: no significant drainage  Lab Results:  Recent Labs    09/11/17 1945  WBC 16.4*  HGB 12.7*  HCT 37.3*  PLT 175   BMET Recent Labs    09/11/17 1945  NA 137  K 4.0  CL 100  CO2 30  GLUCOSE 120*  BUN 11  CREATININE 1.07  CALCIUM 9.0   PT/INR No results for input(s): LABPROT, INR in the last 72 hours. ABG No results for input(s): PHART, HCO3 in the last 72 hours.  Invalid input(s): PCO2, PO2  MEDS, Scheduled . acetaminophen  1,000 mg Oral Q6H  . enoxaparin (LOVENOX) injection  40 mg Subcutaneous Q24H  . senna-docusate  1 tablet Oral QHS    Studies/Results: No results found.  Assessment: s/p Procedure(s): LAPAROSCOPIC CHOLECYSTECTOMY Patient Active Problem List   Diagnosis Date Noted  . Cholecystitis with cholelithiasis 09/11/2017    Expected post op course.  Gangrenous GB  Plan: Advance diet as tolerated COnt to ambulate Cont drain for removal next week in office Cont IV abx today.  If no further fevers, will switch to PO abx for a 10 day course Hopefully d/c this weekend   LOS: 0 days     .Vanita PandaAlicia C Rayburn Mundis, MD [facility], [place] [phone]   09/13/2017 9:13 AM

## 2017-09-13 NOTE — Anesthesia Postprocedure Evaluation (Signed)
Anesthesia Post Note  Patient: Ryan Sutton  Procedure(s) Performed: LAPAROSCOPIC CHOLECYSTECTOMY (N/A Abdomen)     Patient location during evaluation: PACU Anesthesia Type: General Level of consciousness: awake and alert Pain management: pain level controlled Vital Signs Assessment: post-procedure vital signs reviewed and stable Respiratory status: spontaneous breathing, nonlabored ventilation, respiratory function stable and patient connected to nasal cannula oxygen Cardiovascular status: blood pressure returned to baseline and stable Postop Assessment: no apparent nausea or vomiting Anesthetic complications: no    Last Vitals:  Vitals:   09/13/17 0112 09/13/17 0424  BP: 123/71 131/74  Pulse: 69 (!) 58  Resp: 16 16  Temp: 37.1 C 36.6 C  SpO2: 98% 99%    Last Pain:  Vitals:   09/13/17 0517  TempSrc:   PainSc: Asleep                 Maurilio Puryear

## 2017-09-13 NOTE — Progress Notes (Signed)
Changed dressing on drain at 1240.  No drainage noted from site.  Moderate bloody  drainage noted on old bandage. Patient rates pain at 5/10.  09/13/17 12:54 PM Edwin DadaHaley E Eldora Napp, RN

## 2017-09-14 MED ORDER — AMOXICILLIN-POT CLAVULANATE 875-125 MG PO TABS: 1.0000 | ORAL_TABLET | Freq: Two times a day (BID) | ORAL | 0 refills | Status: AC

## 2017-09-14 NOTE — Progress Notes (Addendum)
Herold HarmsGabriel Vankleeck to be D/C'd Home per MD order.  Discussed with the patient and all questions fully answered.  VSS, Skin clean, dry and intact without evidence of skin break down, no evidence of skin tears noted. IV catheter discontinued intact. Site without signs and symptoms of complications. Dressing and pressure applied.  Education provided on drain care and emptying processes. Patient and wife verbalized understanding.   An After Visit Summary was printed and given to the patient. Patient received prescription. Rx was sent to local pharmacy.   D/c education completed with patient/family including follow up instructions, medication list, d/c activities limitations if indicated, with other d/c instructions as indicated by MD - patient able to verbalize understanding, all questions fully answered.   Patient instructed to return to ED, call 911, or call MD for any changes in condition.   Patient escorted via WC, and D/C home via private auto.  Clydene Fakeatricia D Chanique Duca 09/14/2017 10:35 AM

## 2017-09-14 NOTE — Discharge Summary (Signed)
Physician Discharge Summary [facility]  Patient ID: Ryan Sutton MRN: [id number] DOB/AGE: 43/01/1975 43 y.o.  Admit date: 09/11/2017 Discharge date: 09/14/2017  Admission Diagnoses:  Acute gangrenous cholecystitis, cholelithiasis  Discharge Diagnoses:  Active Problems:   Cholecystitis with cholelithiasis   Discharged Condition: good  Hospital Course: Patient was admitted for observation following gallbladder surgery.  Post op course was uncomplicated.  Pain was well controlled.  Tolerated diet.  IV Zosyn given for 3 days, converted to oral Augmentin at discharge for one week.  Instructed in drain care.  Will remove in office next week.  Patient was prepared for discharge home on POD#2.  Consults: None  Treatments: surgery: lap chole with iOC, drain placement  Discharge Exam: Blood pressure 126/76, pulse 60, temperature 98.5 F (36.9 C), temperature source Oral, resp. rate 17, height 6' (1.829 m), weight 83.9 kg, SpO2 98 %. HEENT - clear Neck - soft Chest - clear bilaterally Cor - RRR Abd - soft without distension; BS present; drain RUQ with serosanguinous; incisions dry and intact  Disposition: Home  Discharge Instructions    Care order/instruction   Complete by:  As directed    Instruct patient in drain care before discharge home today.   Diet - low sodium heart healthy   Complete by:  As directed    Discharge instructions   Complete by:  As directed    [facility]  LAPAROSCOPIC SURGERY:  POST-OP INSTRUCTIONS  Always review your discharge instruction sheet given to you by the facility where your surgery was performed.  A prescription for pain medication may be given to you upon discharge.  Take your pain medication as prescribed.  If narcotic pain medicine is not needed, then you may take acetaminophen (Tylenol) or ibuprofen (Advil) as needed.  Take your usually prescribed medications unless otherwise directed.  If you  need a refill on your pain medication, please contact your pharmacy.  They will contact our office to request authorization. Prescriptions will not be filled after 5 P.M. or on weekends.  You should follow a light diet the first few days after arrival home, such as soup and crackers or toast.  Be sure to include plenty of fluids daily.  Most patients will experience some swelling and bruising in the area of the incisions.  Ice packs will help.  Swelling and bruising can take several days to resolve.   It is common to experience some constipation after surgery.  Increasing fluid intake and taking a stool softener (such as Colace) will usually help or prevent this problem from occurring.  A mild laxative (Milk of Magnesia or Miralax) should be taken according to package instructions if there has been no bowel movement after 48 hours.  You will have steri-strips and a gauze dressing over your incisions.  You may remove the gauze bandage on the second day after surgery, and you may shower at that time.  Leave your steri-strips (small skin tapes) in place directly over the incision.  These strips should remain on the skin for 5-7 days and then be removed.  You may get them wet in the shower and pat them dry.  Any sutures or staples will be removed at the office during your follow-up visit.  ACTIVITIES:  You may resume regular (light) daily activities beginning the next day - such as daily self-care, walking, climbing stairs - gradually increasing activities as tolerated.  You may have sexual intercourse when it is comfortable.  Refrain  from any heavy lifting or straining until approved by your doctor.  You may drive when you are no longer taking prescription pain medication, you can comfortably wear a seatbelt, and you can safely maneuver your car and apply brakes.  You should see your doctor in the office for a follow-up appointment approximately 2-3 weeks after your surgery.  Make sure that you call for  this appointment within a day or two after you arrive home to insure a convenient appointment time.  WHEN TO CALL YOUR DOCTOR: Fever over 101.0 Inability to urinate Continued bleeding from incision Increased pain, redness, or drainage from the incision Increasing abdominal pain  The clinic staff is available to answer your questions during regular business hours.  Please don't hesitate to call and ask to speak to one of the nurses for clinical concerns.  If you have a medical emergency, go to the nearest emergency room or call 911.  A surgeon from [facility] is always on call for the hospital.  Velora Heckler, MD, [facility] Office: [phone] Toll Free:  [phone] FAX [phone]  Website: [email or web address]   Discharge wound care:   Complete by:  As directed    Change dressing at drain site daily and as needed.  Drain care as instructed.   Increase activity slowly   Complete by:  As directed      Allergies as of 09/14/2017      Reactions   Latex Rash   Shellfish Allergy Swelling      Medication List    TAKE these medications   acetaminophen 500 MG tablet Commonly known as:  TYLENOL Take 1,000 mg by mouth daily as needed for moderate pain.   amoxicillin-clavulanate 875-125 MG tablet Commonly known as:  AUGMENTIN Take 1 tablet by mouth every 12 (twelve) hours for 7 days.   Fish Oil 1000 MG Caps Take by mouth.   THERA Tabs Take by mouth.   zinc gluconate 50 MG tablet Take by mouth.            Discharge Care Instructions  (From admission, onward)         Start     Ordered   09/14/17 0000  Discharge wound care:    Comments:  Change dressing at drain site daily and as needed.  Drain care as instructed.   09/14/17 0915         Follow-up Information    [facility] Follow up on 09/19/2017.   Specialty:  General Surgery Why:  1:45pm, arrive by 1:15pm for paperwork and check in.   this is for your JP drain removal Contact information: [address] [place] [address] [phone]        Romie Levee, MD. Schedule an appointment as soon as possible for a visit in 5 day(s).   Specialty:  General Surgery Why:  Zena Amos information: [address] [place] [address] [phone]           Velora Heckler, MD, [facility] Office: [phone]   Signed: Velora Heckler 09/14/2017, 9:16 AM

## 2017-10-21 ENCOUNTER — Telehealth: Payer: Self-pay | Admitting: *Deleted

## 2017-10-21 DIAGNOSIS — M5430 Sciatica, unspecified side: Secondary | ICD-10-CM

## 2017-10-21 NOTE — Telephone Encounter (Signed)
Copied from CRM [id number]. Topic: Referral - Request for Referral >> Oct 21, 2017  3:46 PM Marylen Ponto wrote: Has patient seen PCP for this complaint? yes  *If NO, is insurance requiring patient see PCP for this issue before PCP can refer them? Referral for which specialty: Sports Medicine Preferred provider/office: Berline Chough / [facility] Reason for referral: Sciatica  * Pt requests referral in case his insurance requires referral to see a specialist

## 2017-10-22 NOTE — Telephone Encounter (Signed)
Referral made as pt requested to [facility]

## 2017-10-23 NOTE — Telephone Encounter (Signed)
Patient was called and scheduled at [facility] with Dr Berline Chough and patient is aware.

## 2017-10-24 ENCOUNTER — Encounter: Payer: Self-pay | Admitting: Sports Medicine

## 2017-10-24 ENCOUNTER — Ambulatory Visit (INDEPENDENT_AMBULATORY_CARE_PROVIDER_SITE_OTHER): Payer: BLUE CROSS/BLUE SHIELD

## 2017-10-24 ENCOUNTER — Ambulatory Visit: Payer: BLUE CROSS/BLUE SHIELD | Admitting: Family Medicine

## 2017-10-24 ENCOUNTER — Ambulatory Visit (INDEPENDENT_AMBULATORY_CARE_PROVIDER_SITE_OTHER): Payer: BLUE CROSS/BLUE SHIELD | Admitting: Sports Medicine

## 2017-10-24 VITALS — BP 120/68 | HR 72 | Ht 72.0 in | Wt 183.4 lb

## 2017-10-24 DIAGNOSIS — M9905 Segmental and somatic dysfunction of pelvic region: Secondary | ICD-10-CM

## 2017-10-24 DIAGNOSIS — M24552 Contracture, left hip: Secondary | ICD-10-CM | POA: Diagnosis not present

## 2017-10-24 DIAGNOSIS — M9903 Segmental and somatic dysfunction of lumbar region: Secondary | ICD-10-CM | POA: Diagnosis not present

## 2017-10-24 DIAGNOSIS — M9906 Segmental and somatic dysfunction of lower extremity: Secondary | ICD-10-CM | POA: Diagnosis not present

## 2017-10-24 DIAGNOSIS — R29898 Other symptoms and signs involving the musculoskeletal system: Secondary | ICD-10-CM | POA: Diagnosis not present

## 2017-10-24 DIAGNOSIS — M9908 Segmental and somatic dysfunction of rib cage: Secondary | ICD-10-CM | POA: Diagnosis not present

## 2017-10-24 DIAGNOSIS — M5416 Radiculopathy, lumbar region: Secondary | ICD-10-CM | POA: Diagnosis not present

## 2017-10-24 DIAGNOSIS — M47816 Spondylosis without myelopathy or radiculopathy, lumbar region: Secondary | ICD-10-CM | POA: Diagnosis not present

## 2017-10-24 DIAGNOSIS — M9904 Segmental and somatic dysfunction of sacral region: Secondary | ICD-10-CM

## 2017-10-24 NOTE — Progress Notes (Signed)
Ryan Sutton. Ryan Sutton Sports Medicine [facility] [phone]  Ryan Sutton - 42 y.o. male MRN [id number]  Date of birth: 1975-09-05  Visit Date: 10/24/2017  PCP: Hannah Beat, MD   Referred by: Hannah Beat, MD   Scribe(s) for today's visit: Stevenson Clinch, CMA  SUBJECTIVE:  Ryan Sutton is here for Initial Assessment (L lumbar radiculopathy)  Referred by: Dr. Kerby Nora  HPI: His L-sided LBP symptoms INITIALLY: Began several years ago and has been intermittent. No known injury/truama. Sx started to flare-up again about 1 year ago. Sx have been getting progressively worse.  Described as mild burning, radiating to the L leg, at times into the foot. No radicular pains at this time.  Worsened with walking and moving around Improved with sitting on something soft.  Additional associated symptoms include: He denies LBP. He has some pain in the L gluteal region, piriformis. Burning sensation. He reports burning sensation in his foot which resolved about 1 month after taking Prednisone.     At this time symptoms are worsening compared to onset, progressively worse over the past year but pt reports it is off and on.  He has tried Prednisone taper and Amitriptyline in the past with some relief. He is no longer taking Amitriptyline. He has purchased a new bed and new shoes, worked on posture while working, home exercises - he reports minimal/short term relief. He has tried taking Advil or Tylenol. He has tried heat and ice in the past with no relief.   No recent XR of lower back.   REVIEW OF SYSTEMS: Denies night time disturbances. Denies fevers, chills, or night sweats. Denies unexplained weight loss. Denies personal history of cancer. Reports changes in bowel or bladder habits - related to cholecystectomy. Denies recent unreported falls. Denies new or worsening dyspnea or wheezing. Denies headaches or dizziness.  Reports numbness,  tingling or weakness in the extremities.  Denies dizziness or presyncopal episodes Denies lower extremity edema    HISTORY:  Prior history reviewed and updated per electronic medical record.  Social History   Occupational History  . Occupation: tattoo    Comment: [employer]  Tobacco Use  . Smoking status: Former Games developer  . Smokeless tobacco: Former Engineer, water and Sexual Activity  . Alcohol use: Yes  . Drug use: Not Currently  . 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 Past Surgical History:  Procedure Laterality Date  . CHOLECYSTECTOMY N/A 09/12/2017   Procedure: LAPAROSCOPIC CHOLECYSTECTOMY;  Surgeon: Romie Levee, MD;  Location: [facility];  Service: General;  Laterality: N/A;  . FRACTURE SURGERY     family history is not on file.  DATA OBTAINED & REVIEWED:   Recent Labs    09/11/17 1945  CALCIUM 9.0  AST 140*  ALT 200*   No problems updated. No specialty comments available. X-rays lumbar spine obtained on 10/24/2017: Effectively negative other than large colonic stool burden.  Mild degenerative disc at L5-S1  OBJECTIVE:  VS:  HT:6' (182.9 cm)   WT:183 lb 6.4 oz (83.2 kg)  BMI:24.87    BP:120/68  HR:72bpm  TEMP: ( )  RESP:98 %   PHYSICAL EXAM: CONSTITUTIONAL: Well-developed, Well-nourished and In no acute distress EYES: Pupils are equal., EOM intact without nystagmus. and No  scleral icterus. Psychiatric: Alert & appropriately interactive. and Not depressed or anxious appearing. EXTREMITY EXAM: Warm and well perfused Pulses: DP Pulses: Bilaterally normal and symmetric PT Pulses: Bilaterally normal and symmetric Edema: No significant swelling or edema no pain to palpation, good flexion and extension, reflexes are 2+ and symmetric, motor and sensory appear to be normal, negative SLR  test Moderately tight hip flexors worse on the left than the right. Tenderness over the TFL with TFL predominant recruitment pattern.   ASSESSMENT   1. Left lumbar radiculopathy   2. Weakness of left hip   3. Left hip flexor tightness   4. Somatic dysfunction of lumbar region   5. Somatic dysfunction of pelvis region   6. Somatic dysfunction of sacral region   7. Somatic dysfunction of lower extremity      PROCEDURES:   Home Therapeutic exercises prescribed per procedure note.   Osteopathic manipulation was performed today based on physical exam findings.  Please see procedure note for further information including Osteopathic Exam findings    PLAN:  Pertinent additional documentation may be included in corresponding procedure notes, imaging studies, problem based documentation and patient instructions.  No problem-specific Assessment & Plan notes found for this encounter.  Symptoms are consistent with functional hip and low back pain.  No evidence of significant degenerative changes and markedly tight anterior chain.  Links to [employer] provided today per Patient Instructions.  These exercises were developed by Myles Lipps, DC with a strong emphasis on core neuromuscular reducation and postural realignment through body-weight exercises. Discussed the underlying features of tight hip flexors leading to crouched, fetal like position that results in spinal column compression.  Including lumbar hyperflexion with hypermobility, thoracic flexion with restrictive rotation and cervical lordosis reversal Activity modifications and the importance of avoiding exacerbating activities (limiting pain to no more than a 4 / 10 during or following activity) recommended and discussed. Discussed red flag symptoms that warrant earlier emergent evaluation and patient voices understanding. No orders of the defined types were placed in this encounter.  Lab Orders  No laboratory  test(s) ordered today   Imaging Orders     DG Lumbar Spine 2-3 Views Referral Orders  No referral(s) requested today    Return in about 4 weeks (around 11/21/2017) for consideration of repeat osteopathic manipulation.     CMA/ATC served as Neurosurgeon during this visit. History, Physical, and Plan performed by medical provider. Documentation and orders reviewed and attested to.      Andrena Mews, DO    [facility] Physician

## 2017-10-24 NOTE — Patient Instructions (Addendum)
Also check out [employer]" which is a program developed by Dr. Myles Lipps.   There are links to a couple of his YouTube Videos below and I would like to see performing one of his videos 5-6 days per week.    A good intro video is: "Independence from Pain 7-minute Video" - [email or web address]   Exercises that focus more on the neck are as below: Dr. Derrill Kay with [employer] [facility] teaching neck and shoulder details Part 1 - https://youtu.be/[email or web address] Part 2 Dr. Derrill Kay with [facility] quick routine to practice daily - https://youtu.be/[email or web address]  Do not try to attempt the entire video when first beginning.    Try breaking of each exercise that he goes into shorter segments.  Otherwise if they perform an exercise for 45 seconds, start with 15 seconds and rest and then resume when they begin the new activity.  If you work your way up to being able to do these videos without having to stop, I expect you will see significant improvements in your pain.  If you enjoy his videos and would like to find out more you can look on his website: [email or web address].  He has a workout streaming option as well as a DVD set available for purchase.  Amazon has the best price for his DVDs.     Please perform the exercise program that we have prepared for you and gone over in detail on a daily basis.  In addition to the handout you were provided you can access your program through: [email or web address]   Your unique program code is:  [email or web address]

## 2017-10-24 NOTE — Progress Notes (Signed)
PROCEDURE NOTE: THERAPEUTIC EXERCISES (97110) 15 minutes spent for Therapeutic exercises as below and as referenced in the AVS.  This included exercises focusing on stretching, strengthening, with significant focus on eccentric aspects.   Proper technique shown and discussed handout in great detail with ATC.  All questions were discussed and answered.   Long term goals include an improvement in range of motion, strength, endurance as well as avoiding reinjury. Frequency of visits is one time as determined during today's  office visit. Frequency of exercises to be performed is as per handout.  EXERCISES REVIEWED:  Goodman Exercises  Pelvic recruitment/tilting  Hip flexor stretching 

## 2017-10-24 NOTE — Progress Notes (Signed)
PROCEDURE NOTE : OSTEOPATHIC MANIPULATION The decision today to treat with Osteopathic Manipulative Therapy (OMT) was based on physical exam findings. Verbal consent was obtained following a discussion with the patient regarding the of risks, benefits and potential side effects, including an acute pain flare,post manipulation soreness and need for repeat treatments.     Contraindications to OMT: NONE  Manipulation was performed as below: Regions Treated OMT Techniques Used  Lumbar spine Pelvis Sacrum Lower extremities muscle energy myofascial release soft tissue   The patient tolerated the treatment well and reported Improved symptoms following treatment today. Patient was given medications, exercises, stretches and lifestyle modifications per AVS and verbally.   OSTEOPATHIC/STRUCTURAL EXAM:   L4 FRS left (Flexed, Rotated & Sidebent) Left psoas spasm Left anterior innonimate R on R sacral torsion left iliacus trigger point, left externally rotated hip joint

## 2017-11-21 ENCOUNTER — Encounter: Payer: Self-pay | Admitting: Sports Medicine

## 2017-11-21 ENCOUNTER — Ambulatory Visit: Payer: BLUE CROSS/BLUE SHIELD | Admitting: Sports Medicine

## 2017-11-21 ENCOUNTER — Ambulatory Visit (INDEPENDENT_AMBULATORY_CARE_PROVIDER_SITE_OTHER): Payer: BLUE CROSS/BLUE SHIELD | Admitting: Sports Medicine

## 2017-11-21 VITALS — BP 112/76 | HR 67 | Ht 72.0 in | Wt 186.6 lb

## 2017-11-21 DIAGNOSIS — M9908 Segmental and somatic dysfunction of rib cage: Secondary | ICD-10-CM

## 2017-11-21 DIAGNOSIS — M9904 Segmental and somatic dysfunction of sacral region: Secondary | ICD-10-CM

## 2017-11-21 DIAGNOSIS — M9902 Segmental and somatic dysfunction of thoracic region: Secondary | ICD-10-CM

## 2017-11-21 DIAGNOSIS — R29898 Other symptoms and signs involving the musculoskeletal system: Secondary | ICD-10-CM | POA: Diagnosis not present

## 2017-11-21 DIAGNOSIS — M9903 Segmental and somatic dysfunction of lumbar region: Secondary | ICD-10-CM

## 2017-11-21 DIAGNOSIS — M9905 Segmental and somatic dysfunction of pelvic region: Secondary | ICD-10-CM

## 2017-11-21 DIAGNOSIS — M24552 Contracture, left hip: Secondary | ICD-10-CM | POA: Diagnosis not present

## 2017-11-21 NOTE — Progress Notes (Signed)
[name]. [name] [phone]  Ryan Sutton - 42 y.o. male MRN [id number]  Date of birth: 20-Nov-1975  Visit Date: 11/21/2017  PCP: Hannah Beat, MD   Referred by: Hannah Beat, MD   Scribe(s) for today's visit: Christoper Fabian, LAT, ATC  SUBJECTIVE:  Ryan Sutton is here for Follow-up (L lumbar radiculopathy)  Referred by: Dr. Kerby Nora  HPI: His L-sided LBP symptoms INITIALLY: Began several years ago and has been intermittent. No known injury/truama. Sx started to flare-up again about 1 year ago. Sx have been getting progressively worse.  Described as mild burning, radiating to the L leg, at times into the foot. No radicular pains at this time.  Worsened with walking and moving around Improved with sitting on something soft.  Additional associated symptoms include: He denies LBP. He has some pain in the L gluteal region, piriformis. Burning sensation. He reports burning sensation in his foot which resolved about 1 month after taking Prednisone.     At this time symptoms are worsening compared to onset, progressively worse over the past year but pt reports it is off and on.  He has tried Prednisone taper and Amitriptyline in the past with some relief. He is no longer taking Amitriptyline. He has purchased a new bed and new shoes, worked on posture while working, home exercises - he reports minimal/short term relief. He has tried taking Advil or Tylenol. He has tried heat and ice in the past with no relief.   No recent XR of lower back.   11/21/2017: Compared to the last office visit on 10/24/17, his previously described lower back and L LE symptoms are improving.  He states that every now and then he'll get some sciatica but it stays in his L glute.  He reports a burning type pain along the lateral glute/hip area where the piriformis inserts.  He does get some random numbness on the bottom of his L foot.  He  states that he con't to have issues w/ unsupported sitting.  Pt states that he feels approximately 50% improved.  He has been doing a lot of single leg lower body work at the gym including split squats, rev. Lunges, SL RDLs and goblet squats. Current symptoms are mild pain that is isolated to the back and L glute w/o radiating pain past his glute  & are radiating to the L glute.  Long walks and prolonged sitting tend to irritate his symptoms. He has been doing his HEP 5-6x/week.  He does the [employer] occasionally.  L-spine XR - 10/24/17  REVIEW OF SYSTEMS: Denies night time disturbances. Denies fevers, chills, or night sweats. Denies unexplained weight loss. Denies personal history of cancer. Reports changes in bowel or bladder habits - related to cholecystectomy. Denies recent unreported falls. Denies new or worsening dyspnea or wheezing. Denies headaches or dizziness.  Reports numbness, tingling or weakness in the extremities.  Denies dizziness or presyncopal episodes Denies lower extremity edema    HISTORY:  Prior history reviewed and updated per electronic medical record.  Social History   Occupational History  . Occupation: tattoo    Comment: [employer]  Tobacco Use  . Smoking status: Former Games developer  . Smokeless tobacco: Former Engineer, water and Sexual Activity  . Alcohol use: Yes  . Drug use: Not Currently  . 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 No problems updated. . 10/24/17 - Mild degenerative changes at L5/S1 .   OBJECTIVE:  VS:  HT:6' (182.9 cm)   WT:186 lb 9.6 oz (84.6 kg)  BMI:25.3    BP:112/76  HR:67bpm  TEMP: ( )  RESP:98 %   PHYSICAL EXAM: CONSTITUTIONAL: Well-developed, Well-nourished and In  no acute distress PSYCHIATRIC: Alert & appropriately interactive. and Not depressed or anxious appearing. RESPIRATORY: No increased work of breathing and Trachea Midline EYES: Pupils are equal., EOM intact without nystagmus. and No scleral icterus.  VASCULAR EXAM: Warm and well perfused NEURO: unremarkable  MSK Exam: BACK Exam: Normal alignment & Contours Skin: No overlying erythema/ecchymosis  MOTOR TESTING: Intact in all LE myotomes and Able to heel and toe walk without difficutly        RIGHT    LEFT Straight leg raise-------------------------: normal, no pain                         normal, no pain Braggard Stretch Test------------------: normal, no pain                         normal, no pain   ASSESSMENT   1. Weakness of left hip   2. Left hip flexor tightness   3. Somatic dysfunction of lumbar region   4. Somatic dysfunction of rib cage region   5. Somatic dysfunction of pelvis region   6. Somatic dysfunction of sacral region   7. Somatic dysfunction of thoracic region     PLAN:  Pertinent additional documentation may be included in corresponding procedure notes, imaging studies, problem based documentation and patient instructions.  Procedures:  . Osteopathic manipulation was performed today based on physical exam findings.  Please see procedure note for further information including Osteopathic Exam findings  Medications:  No orders of the defined types were placed in this encounter.  Discussion/Instructions: No problem-specific Assessment & Plan notes found for this encounter.  . doing well. steady improvement with OMT and HEP . Continue previously prescribed home exercise program.  . Discussed red flag symptoms that warrant earlier emergent evaluation and patient voices understanding. . Activity modifications and the importance of avoiding exacerbating activities (limiting pain to no more than a 4 / 10 during or following activity) recommended and  discussed.  Follow-up:  . Return in about 4 weeks (around 12/19/2017) for consideration of repeat osteopathic manipulation.  . If any lack of improvement: consider further diagnostic evaluation with X-ray hip and or MRI lumbar spine and consider referral to Physical Therapy . At follow up will plan: to consider repeat osteopathic manipulation     CMA/ATC served as scribe during this visit. History, Physical, and Plan performed by medical provider. Documentation and orders reviewed and attested to.      Andrena MewsMichael D Tierney Behl, DO    [facility] Physician

## 2017-11-21 NOTE — Progress Notes (Signed)
PROCEDURE NOTE : OSTEOPATHIC MANIPULATION The decision today to treat with Osteopathic Manipulative Therapy (OMT) was based on physical exam findings. Verbal consent was obtained following a discussion with the patient regarding the of risks, benefits and potential side effects, including an acute pain flare,post manipulation soreness and need for repeat treatments.     Contraindications to OMT: NONE  Manipulation was performed as below: Regions Treated OMT Techniques Used  1. Thoracic spine 2. Ribs 3. Lumbar spine 4. Pelvis 5. Sacrum . HVLA . muscle energy . myofascial release   The patient tolerated the treatment well and reported Improved symptoms following treatment today. Patient was given medications, exercises, stretches and lifestyle modifications per AVS and verbally.   OSTEOPATHIC/STRUCTURAL EXAM:   T4 -8 Neutral, Rotated LEFT, Sidebent RIGHT Rib 6 Right  Posterior L4 FRS right (Flexed, Rotated & Sidebent) Left psoas spasm Left anterior innonimate R on R sacral torsion

## 2017-12-19 ENCOUNTER — Ambulatory Visit (INDEPENDENT_AMBULATORY_CARE_PROVIDER_SITE_OTHER): Payer: BLUE CROSS/BLUE SHIELD | Admitting: Sports Medicine

## 2017-12-19 ENCOUNTER — Encounter: Payer: Self-pay | Admitting: Sports Medicine

## 2017-12-19 VITALS — BP 108/72 | HR 68 | Ht 72.0 in | Wt 188.0 lb

## 2017-12-19 DIAGNOSIS — M9904 Segmental and somatic dysfunction of sacral region: Secondary | ICD-10-CM

## 2017-12-19 DIAGNOSIS — R29898 Other symptoms and signs involving the musculoskeletal system: Secondary | ICD-10-CM

## 2017-12-19 DIAGNOSIS — M24552 Contracture, left hip: Secondary | ICD-10-CM

## 2017-12-19 DIAGNOSIS — M9901 Segmental and somatic dysfunction of cervical region: Secondary | ICD-10-CM | POA: Diagnosis not present

## 2017-12-19 DIAGNOSIS — M9908 Segmental and somatic dysfunction of rib cage: Secondary | ICD-10-CM

## 2017-12-19 DIAGNOSIS — M9905 Segmental and somatic dysfunction of pelvic region: Secondary | ICD-10-CM

## 2017-12-19 DIAGNOSIS — M9903 Segmental and somatic dysfunction of lumbar region: Secondary | ICD-10-CM

## 2017-12-19 DIAGNOSIS — M9902 Segmental and somatic dysfunction of thoracic region: Secondary | ICD-10-CM

## 2017-12-19 NOTE — Progress Notes (Signed)
Veverly FellsMichael D. Delorise Shinerigby, DO  [facility] [place] [phone]  Herold HarmsGabriel Speagle - 42 y.o. male MRN [id number]  Date of birth: 12/10/1975  Visit Date:12/19/2017    PCP: Hannah Beatopland, Spencer, MD   Referred by: Hannah Beatopland, Spencer, MD   SUBJECTIVE:  Chief Complaint  Patient presents with  . f/u LBP    Sx are improving. Responded well to OMT. Burning sensation in lower back and gluteal region. Worse with unsupported sitting and after walking, improves with hard surfaces and while walking. Doing HEP daily.     HPI: Reports continued lower back and left hip pain that is mild in nature.  He has been responding well to the osteopathic manipulation.  Symptoms otherwise as above.  No significant radiating pain at this time.  REVIEW OF SYSTEMS: Denies any nighttime disturbances.    Pt denies any change in bowel or bladder habits, muscle weakness, numbness or falls associated with this pain.  HISTORY:  Prior history reviewed and updated per electronic medical record.  Social History   Occupational History  . Occupation: tattoo    Comment: [employer]  Tobacco Use  . Smoking status: Former Games developermoker  . Smokeless tobacco: Former Engineer, waterUser  Substance and Sexual Activity  . Alcohol use: Yes  . Drug use: Not Currently  . Sexual activity: Not on file   Social History   Social History Narrative   Married   Tattoos by Liz BeachGabe, [employer] in [place]   Brazilian Jiu Jitsu, 1st degree black belt    DATA OBTAINED & REVIEWED:  Recent Labs    09/11/17 1945  CALCIUM 9.0  AST 140*  ALT 200*   No problems updated. No specialty comments available.  OBJECTIVE:  VS:  HT:6' (182.9 cm)   WT:188 lb (85.3 kg)  BMI:25.49    BP:108/72  HR:68bpm  TEMP: ( )  RESP:97 %   PHYSICAL EXAM: Adult male.  No acute distress.  Alert and appropriate. Good cervical and lumbar range of motion although there are functional limitations. Negative Spurling's compression test  and Lhermitte's compression test.   Upper extremity lower extremity strength is 5/5 in all myotomes. Normal sensation Significant anterior chain dominant posture.    ASSESSMENT   1. Weakness of left hip   2. Left hip flexor tightness   3. Somatic dysfunction of cervical region   4. Somatic dysfunction of thoracic region   5. Somatic dysfunction of lumbar region   6. Somatic dysfunction of rib cage region   7. Somatic dysfunction of pelvis region   8. Somatic dysfunction of sacral region     PLAN:  Pertinent additional documentation may be included in corresponding procedure notes, imaging studies, problem based documentation and patient instructions.  Procedures:  Osteopathic manipulation was performed today based on physical exam findings.  Please see procedure note for further information including Osteopathic Exam findings      Medications:  No orders of the defined types were placed in this encounter.   Discussion/Instructions: No problem-specific Assessment & Plan notes found for this encounter.   Osteopathic manipulation was performed today based on physical exam findings.  Patient has responded well to osteopathic manipulation previously the prior manipulation did not provide permanent long lasting relief.  The patient does feel as though there was significant benefit to the prior manipulation and they wished for repeat manipulation today.  They understand that home therapeutic exercises are critical part of the healing/treatment process and will continue with self treatment between  now and their next visit as outlined.  The patient understands that the frequency of visits is meant to provide a stimulus to promote the body's own ability to heal and is not meant to be the sole means for improvement in their symptoms.  Return in about 3 months (around 03/20/2018) for consideration of repeat Osteopathic Manipulation.          Andrena MewsMichael D Rigby, DO    [facility]  Physician

## 2017-12-19 NOTE — Progress Notes (Signed)
PROCEDURE NOTE : OSTEOPATHIC MANIPULATION The decision today to treat with Osteopathic Manipulative Therapy (OMT) was based on physical exam findings. Verbal consent was obtained following a discussion with the patient regarding the of risks, benefits and potential side effects, including an acute pain flare,post manipulation soreness and need for repeat treatments.     Contraindications to OMT: NONE  Manipulation was performed as below: Regions Treated & Osteopathic Exam Findings  OA - rotated right C6 Flexed, rotated RIGHT, sidebent LEFT T2 - 8 Neutral, rotated LEFT, sidebent RIGHT Rib 6 Right  Posterior L3 FRS RIGHT (Flexed, Rotated & Sidebent) Left psoas spasm Left anterior innonimate R on R sacral torsion   OMT Techniques Used  . HVLA . muscle energy . myofascial release    The patient tolerated the treatment well and reported Improved symptoms following treatment today. Patient was given medications, exercises, stretches and lifestyle modifications per AVS and verbally.

## 2018-01-17 ENCOUNTER — Encounter: Payer: Self-pay | Admitting: Sports Medicine

## 2018-02-07 ENCOUNTER — Encounter: Payer: Self-pay | Admitting: Sports Medicine

## 2018-03-20 ENCOUNTER — Ambulatory Visit: Payer: BLUE CROSS/BLUE SHIELD | Admitting: Sports Medicine

## 2018-04-03 ENCOUNTER — Ambulatory Visit: Payer: BLUE CROSS/BLUE SHIELD | Admitting: Sports Medicine

## 2019-01-29 DIAGNOSIS — Z0000 Encounter for general adult medical examination without abnormal findings: Secondary | ICD-10-CM | POA: Diagnosis not present

## 2019-01-29 DIAGNOSIS — Z13220 Encounter for screening for lipoid disorders: Secondary | ICD-10-CM | POA: Diagnosis not present

## 2019-01-29 DIAGNOSIS — R5383 Other fatigue: Secondary | ICD-10-CM | POA: Diagnosis not present

## 2019-01-29 DIAGNOSIS — Z6827 Body mass index (BMI) 27.0-27.9, adult: Secondary | ICD-10-CM | POA: Diagnosis not present

## 2019-04-23 DIAGNOSIS — R05 Cough: Secondary | ICD-10-CM | POA: Diagnosis not present

## 2019-07-16 DIAGNOSIS — R05 Cough: Secondary | ICD-10-CM | POA: Diagnosis not present

## 2019-10-15 DIAGNOSIS — R053 Chronic cough: Secondary | ICD-10-CM | POA: Diagnosis not present

## 2020-06-15 IMAGING — DX DG LUMBAR SPINE 2-3V
3 series · 3 of 3 positions shown · non-contrast
Comparison: None.

CLINICAL DATA: Chronic low back, left leg and left groin pain. No
known injury.

EXAM:
LUMBAR SPINE - 2-3 VIEW

[lumbar spine ap]
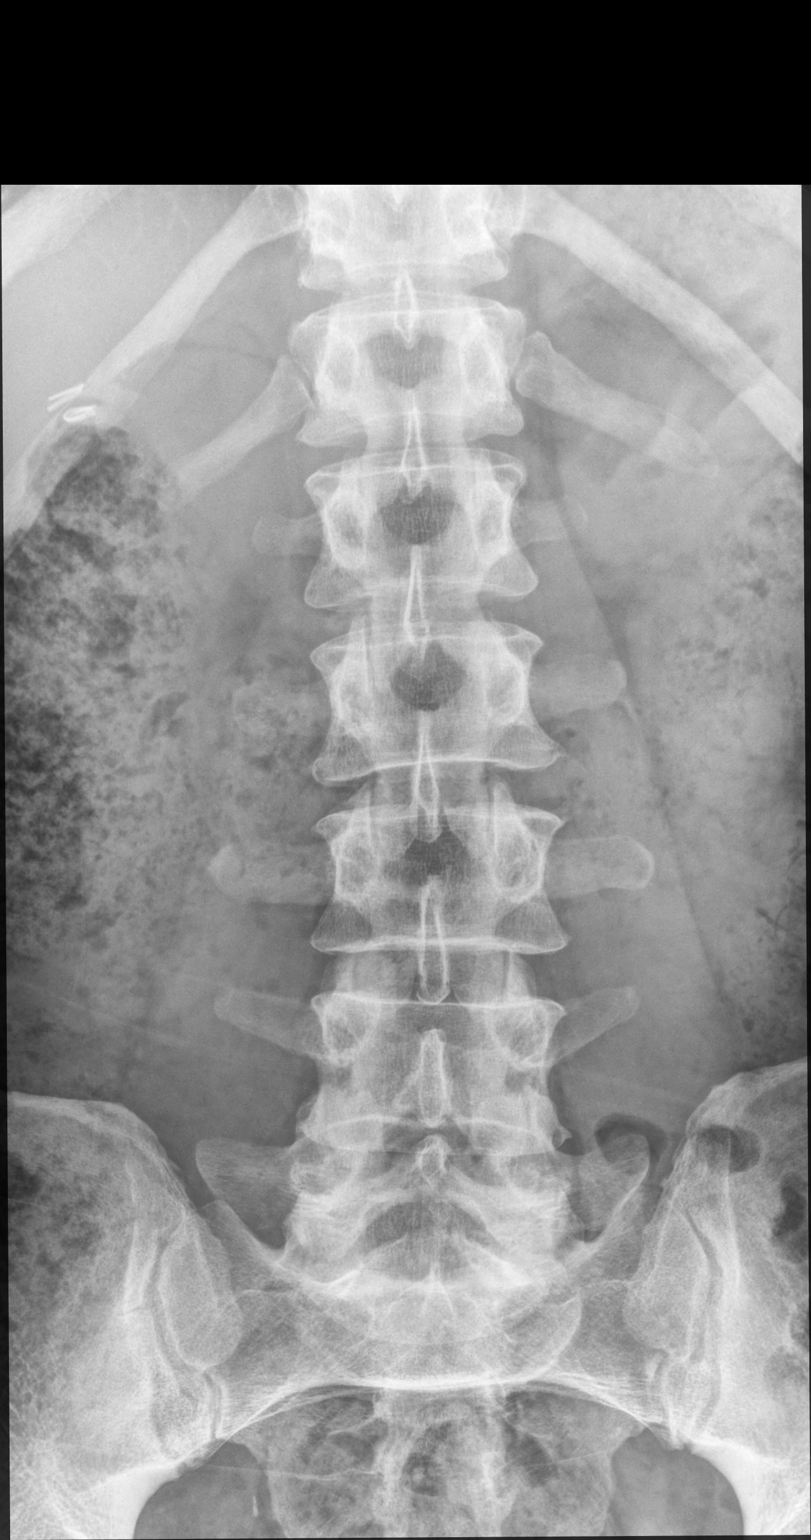

[lumbar spine lat (1 of 2)]
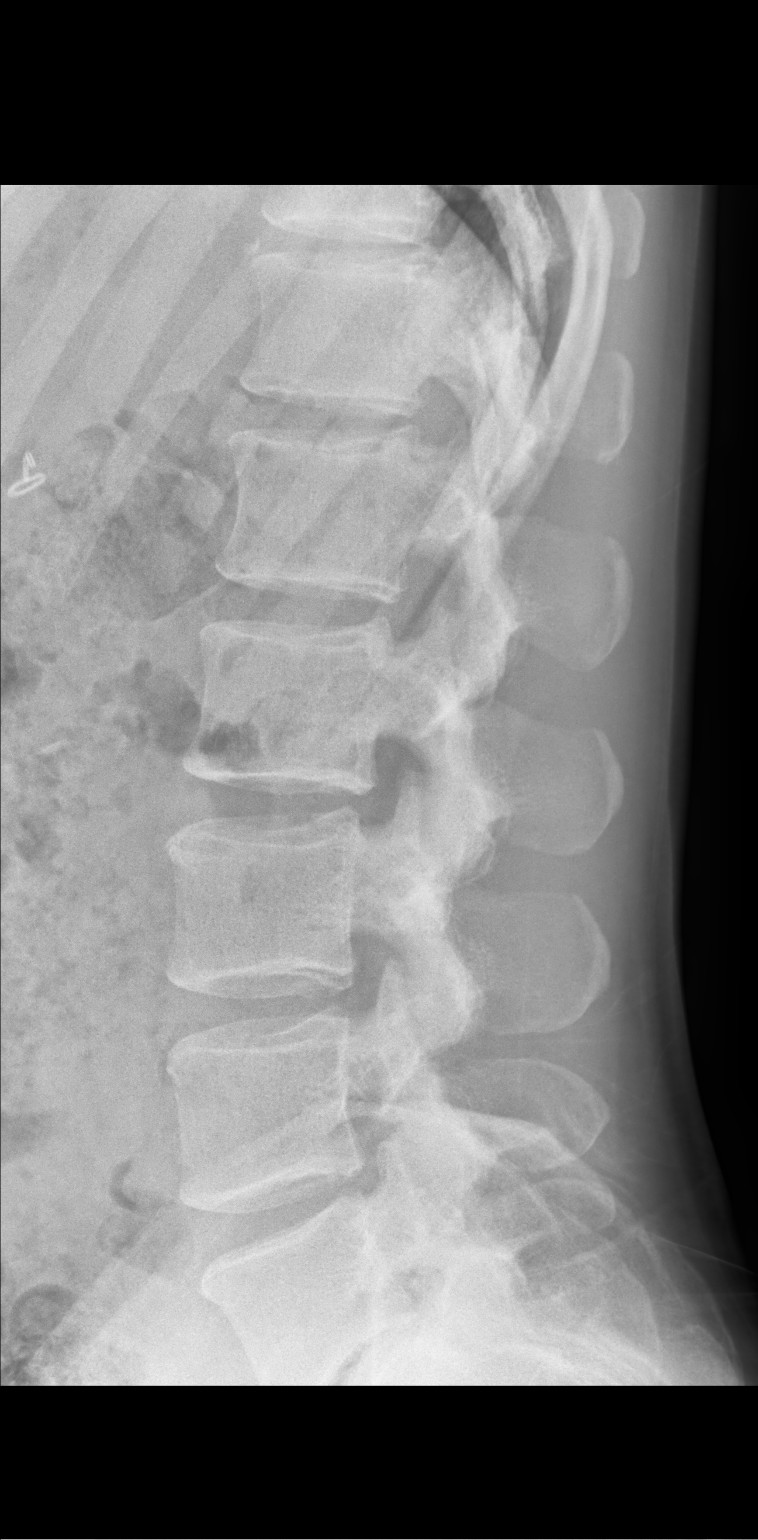

[lumbar spine lat (2 of 2)]
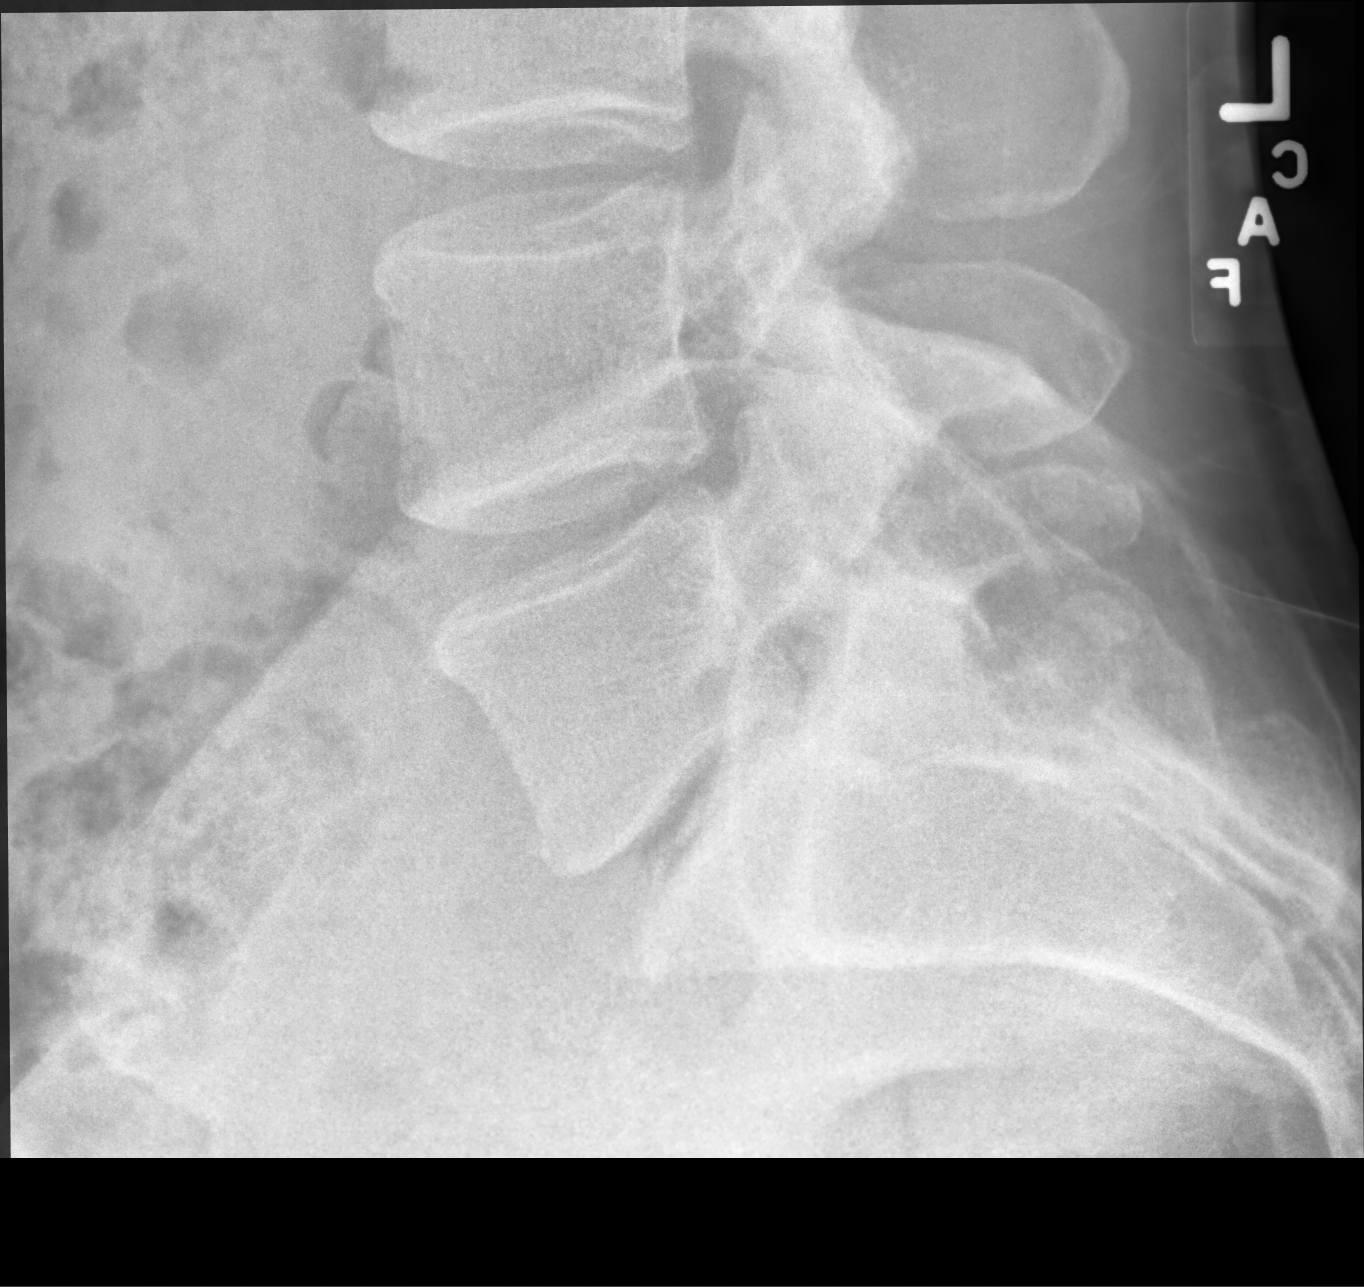

[3 of 3 positions shown; findings below may reference images not displayed]

FINDINGS: Vertebral body height and alignment are maintained. Mild loss of
disc space height and vacuum disc phenomenon L5-S1 noted.
Intervertebral disc space height is otherwise normal. There is a
very large volume of stool in the imaged colon.
IMPRESSION: Mild appearing degenerative disease L5-S1.

Very large colonic stool burden.

## 2022-05-31 ENCOUNTER — Other Ambulatory Visit: Payer: Self-pay | Admitting: Otolaryngology

## 2022-05-31 ENCOUNTER — Ambulatory Visit
Admission: RE | Admit: 2022-05-31 | Discharge: 2022-05-31 | Disposition: A | Discharge status: Home / Self Care | Payer: Self-pay | Source: Ambulatory Visit | Attending: Otolaryngology | Admitting: Otolaryngology

## 2022-05-31 DIAGNOSIS — R053 Chronic cough: Secondary | ICD-10-CM
